# Patient Record
Sex: Female | Born: 1985 | Race: Black or African American | Hispanic: No | Marital: Married | Smoking: Never smoker
Health system: Southern US, Community
[De-identification: ages and names within clinical notes are randomized; demographics above are authoritative.]

## PROBLEM LIST (undated history)

## (undated) ENCOUNTER — Inpatient Hospital Stay (HOSPITAL_COMMUNITY): Payer: Self-pay

## (undated) DIAGNOSIS — R0602 Shortness of breath: Secondary | ICD-10-CM

## (undated) DIAGNOSIS — D649 Anemia, unspecified: Secondary | ICD-10-CM

## (undated) DIAGNOSIS — O4290 Premature rupture of membranes, unspecified as to length of time between rupture and onset of labor, unspecified weeks of gestation: Secondary | ICD-10-CM

## (undated) DIAGNOSIS — F32A Depression, unspecified: Secondary | ICD-10-CM

## (undated) DIAGNOSIS — Z460 Encounter for fitting and adjustment of spectacles and contact lenses: Secondary | ICD-10-CM

## (undated) DIAGNOSIS — D509 Iron deficiency anemia, unspecified: Secondary | ICD-10-CM

## (undated) DIAGNOSIS — Z98891 History of uterine scar from previous surgery: Secondary | ICD-10-CM

## (undated) HISTORY — DX: Anemia, unspecified: D64.9

## (undated) HISTORY — DX: Shortness of breath: R06.02

## (undated) HISTORY — DX: Encounter for fitting and adjustment of spectacles and contact lenses: Z46.0

---
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
## 2006-03-13 DIAGNOSIS — F319 Bipolar disorder, unspecified: Secondary | ICD-10-CM | POA: Insufficient documentation

## 2006-03-13 DIAGNOSIS — F411 Generalized anxiety disorder: Secondary | ICD-10-CM | POA: Insufficient documentation

## 2006-03-13 DIAGNOSIS — F410 Panic disorder [episodic paroxysmal anxiety] without agoraphobia: Secondary | ICD-10-CM | POA: Insufficient documentation

## 2007-07-09 ENCOUNTER — Telehealth: Payer: Self-pay | Admitting: *Deleted

## 2007-08-20 ENCOUNTER — Other Ambulatory Visit: Admission: RE | Admit: 2007-08-20 | Discharge: 2007-08-20 | Payer: Self-pay | Admitting: Obstetrics and Gynecology

## 2007-08-21 ENCOUNTER — Ambulatory Visit (HOSPITAL_COMMUNITY): Admission: RE | Admit: 2007-08-21 | Discharge: 2007-08-21 | Payer: Self-pay | Admitting: *Deleted

## 2007-08-28 ENCOUNTER — Ambulatory Visit (HOSPITAL_COMMUNITY): Admission: RE | Admit: 2007-08-28 | Discharge: 2007-08-28 | Payer: Self-pay | Admitting: *Deleted

## 2007-09-03 ENCOUNTER — Encounter: Admission: RE | Admit: 2007-09-03 | Discharge: 2007-10-06 | Payer: Self-pay | Admitting: *Deleted

## 2007-10-23 ENCOUNTER — Ambulatory Visit (HOSPITAL_COMMUNITY): Admission: RE | Admit: 2007-10-23 | Discharge: 2007-10-23 | Payer: Self-pay | Admitting: *Deleted

## 2008-03-18 ENCOUNTER — Emergency Department (HOSPITAL_COMMUNITY): Admission: EM | Admit: 2008-03-18 | Discharge: 2008-03-19 | Payer: Self-pay | Admitting: Emergency Medicine

## 2008-03-21 ENCOUNTER — Inpatient Hospital Stay (HOSPITAL_COMMUNITY): Admission: AD | Admit: 2008-03-21 | Discharge: 2008-03-22 | Payer: Self-pay | Admitting: Family Medicine

## 2008-03-21 ENCOUNTER — Ambulatory Visit: Payer: Self-pay | Admitting: Family Medicine

## 2008-03-31 ENCOUNTER — Encounter: Payer: Self-pay | Admitting: *Deleted

## 2008-08-25 ENCOUNTER — Encounter: Admission: RE | Admit: 2008-08-25 | Discharge: 2008-09-27 | Payer: Self-pay | Admitting: Surgery

## 2008-09-13 ENCOUNTER — Ambulatory Visit (HOSPITAL_COMMUNITY): Admission: RE | Admit: 2008-09-13 | Discharge: 2008-09-14 | Payer: Self-pay | Admitting: *Deleted

## 2008-09-13 HISTORY — PX: LAPAROSCOPIC GASTRIC BANDING: SHX1100

## 2008-09-27 ENCOUNTER — Encounter: Admission: RE | Admit: 2008-09-27 | Discharge: 2008-09-27 | Payer: Self-pay | Admitting: *Deleted

## 2010-04-20 LAB — DIFFERENTIAL
Basophils Absolute: 0 10*3/uL (ref 0.0–0.1)
Basophils Relative: 0 % (ref 0–1)
Lymphocytes Relative: 15 % (ref 12–46)
Monocytes Relative: 8 % (ref 3–12)
Neutrophils Relative %: 77 % (ref 43–77)

## 2010-04-20 LAB — CBC
Hemoglobin: 8.8 g/dL — ABNORMAL LOW (ref 12.0–15.0)
MCV: 70 fL — ABNORMAL LOW (ref 78.0–100.0)
RDW: 21.5 % — ABNORMAL HIGH (ref 11.5–15.5)

## 2010-04-21 LAB — COMPREHENSIVE METABOLIC PANEL
ALT: 16 U/L (ref 0–35)
Albumin: 3.7 g/dL (ref 3.5–5.2)
Alkaline Phosphatase: 70 U/L (ref 39–117)
Chloride: 106 mEq/L (ref 96–112)
Creatinine, Ser: 0.68 mg/dL (ref 0.4–1.2)
GFR calc Af Amer: >60 mL/min (ref >60)
Potassium: 3.8 mEq/L (ref 3.5–5.1)
Sodium: 136 mEq/L (ref 135–145)

## 2010-04-21 LAB — DIFFERENTIAL
Basophils Relative: 0 % (ref 0–1)
Eosinophils Absolute: 0.1 10*3/uL (ref 0.0–0.7)
Lymphs Abs: 2.2 10*3/uL (ref 0.7–4.0)
Monocytes Absolute: 0.5 10*3/uL (ref 0.1–1.0)

## 2010-04-21 LAB — CBC
MCV: 68.5 fL — ABNORMAL LOW (ref 78.0–100.0)
RBC: 4.15 MIL/uL (ref 3.87–5.11)

## 2010-04-21 LAB — HEMOGLOBIN AND HEMATOCRIT, BLOOD
HCT: 29.6 % — ABNORMAL LOW (ref 36.0–46.0)
Hemoglobin: 9.3 g/dL — ABNORMAL LOW (ref 12.0–15.0)

## 2010-04-21 LAB — PREGNANCY, URINE: Preg Test, Ur: NEGATIVE

## 2010-04-26 LAB — URINALYSIS, ROUTINE W REFLEX MICROSCOPIC
Hgb urine dipstick: NEGATIVE
Ketones, ur: 15 mg/dL — AB
Protein, ur: NEGATIVE mg/dL
Specific Gravity, Urine: 1.027 (ref 1.005–1.030)
pH: 5.5 (ref 5.0–8.0)

## 2010-04-26 LAB — COMPREHENSIVE METABOLIC PANEL
AST: 19 U/L (ref 0–37)
Alkaline Phosphatase: 70 U/L (ref 39–117)
BUN: 5 mg/dL — ABNORMAL LOW (ref 6–23)
CO2: 24 mEq/L (ref 19–32)
Calcium: 8.6 mg/dL (ref 8.4–10.5)
Glucose, Bld: 102 mg/dL — ABNORMAL HIGH (ref 70–99)
Potassium: 3 mEq/L — ABNORMAL LOW (ref 3.5–5.1)
Sodium: 135 mEq/L (ref 135–145)

## 2010-04-26 LAB — CULTURE, BLOOD (ROUTINE X 2): Culture: NO GROWTH

## 2010-04-26 LAB — HEPATIC FUNCTION PANEL
ALT: 14 U/L (ref 0–35)
AST: 14 U/L (ref 0–37)
Albumin: 3.4 g/dL — ABNORMAL LOW (ref 3.5–5.2)
Alkaline Phosphatase: 70 U/L (ref 39–117)
Indirect Bilirubin: 0.5 mg/dL (ref 0.3–0.9)
Total Bilirubin: 0.6 mg/dL (ref 0.3–1.2)

## 2010-04-26 LAB — RETICULOCYTES: Retic Count, Absolute: 37.5 10*3/uL (ref 19.0–186.0)

## 2010-04-26 LAB — CBC
HCT: 23.6 % — ABNORMAL LOW (ref 36.0–46.0)
HCT: 25.7 % — ABNORMAL LOW (ref 36.0–46.0)
Hemoglobin: 8.1 g/dL — ABNORMAL LOW (ref 12.0–15.0)
MCHC: 31.5 g/dL (ref 30.0–36.0)
MCV: 63 fL — ABNORMAL LOW (ref 78.0–100.0)
MCV: 64.3 fL — ABNORMAL LOW (ref 78.0–100.0)
Platelets: 318 10*3/uL (ref 150–400)
RDW: 21.3 % — ABNORMAL HIGH (ref 11.5–15.5)
RDW: 21.9 % — ABNORMAL HIGH (ref 11.5–15.5)

## 2010-04-26 LAB — POCT I-STAT, CHEM 8
BUN: 8 mg/dL (ref 6–23)
Calcium, Ion: 1.17 mmol/L (ref 1.12–1.32)
Chloride: 107 mEq/L (ref 96–112)
Glucose, Bld: 102 mg/dL — ABNORMAL HIGH (ref 70–99)
HCT: 34 % — ABNORMAL LOW (ref 36.0–46.0)
Hemoglobin: 11.6 g/dL — ABNORMAL LOW (ref 12.0–15.0)
Potassium: 4 mEq/L (ref 3.5–5.1)
TCO2: 21 mmol/L (ref 0–100)

## 2010-04-26 LAB — DIFFERENTIAL
Eosinophils Absolute: 0.1 10*3/uL (ref 0.0–0.7)
Eosinophils Relative: 1 % (ref 0–5)
Lymphocytes Relative: 27 % (ref 12–46)
Monocytes Relative: 7 % (ref 3–12)
Neutrophils Relative %: 65 % (ref 43–77)

## 2010-04-26 LAB — IRON AND TIBC
Iron: <10 ug/dL — ABNORMAL LOW (ref 42–135)
UIBC: 327 ug/dL

## 2010-04-26 LAB — URINE CULTURE: Colony Count: NO GROWTH

## 2010-04-26 LAB — BASIC METABOLIC PANEL
BUN: 6 mg/dL (ref 6–23)
Chloride: 106 mEq/L (ref 96–112)
Glucose, Bld: 85 mg/dL (ref 70–99)
Potassium: 3.6 mEq/L (ref 3.5–5.1)

## 2010-04-26 LAB — GRAM STAIN

## 2010-04-26 LAB — URINE MICROSCOPIC-ADD ON

## 2010-05-29 NOTE — H&P (Signed)
Victoria Knight, Victoria Knight             ACCOUNT NO.:  [id number]   MEDICAL RECORD NO.:  [id number]          PATIENT TYPE:  INP   LOCATION:  5128                         FACILITY:  [facility]   PHYSICIAN:  Pearlean Brownie, M.D.DATE OF BIRTH:  [birth year]   DATE OF ADMISSION:  03/21/2008  DATE OF DISCHARGE:                              HISTORY & PHYSICAL   PRIORITY ADMISSION HISTORY AND PHYSICAL   CHIEF COMPLAINT:  Abdominal pain and an anemia,   PRIMARY CARE PHYSICIAN:  [place].   HISTORY OF PRESENT ILLNESS:  This is a 25 year old female with a history  of anemia, who presented with a 3-day history of abdominal pain, fever.  She was seen at [facility] on the 5th, Friday, with  new onset nausea, vomiting, and fever along with dysuria, but her  vomiting was nonbilious and nonbloody and just seemed like food  particles.  She was diagnosed with pyelonephritis and sent home with  Keflex p.o. as well as ibuprofen, Percocet, and antinausea medicines.  Over the weekend, she took the Percocet, which knocked her out and  helped relieve pain, but upon stopping them the pain returned.  She  stopped them because she wanted to go to work today and was unable to  given the abdominal pain.  Her abdominal pain is described as a constant  and dull pain in the left upper and lower quadrants that radiates to the  back and flank area and intermittently sharp, stabbing pains that are 8  out of 10 in intensity along the back and flank areas, and she denies  any radiation just down into the groin or up into the chest.  She did  also continue to have fevers over the weekend so today she went to  [place] to be evaluated, and she was deemed to have failed outpatient  therapy and was sent here for admission and treatment.  She denies chest  pain or tightness, shortness of breath, cough, or recent viral  illnesses.  She also has no sick contacts or any vaginal discharge.  She  does endorse dysuria  and some spotting.  Her last menstrual period was  on the 13th of last month, and her hCG was negative in the ER.   The patient also endorses upon review of systems a watery diarrhea over  the weekend, which seems to have improved over the last 24 hours.  She  denies any blood in the stool.   REVIEW OF SYSTEMS:  As above; otherwise, negative.   ALLERGIES:  None.   MEDICATIONS:  1. Iron sulfate 325 mg b.i.d. but not taking secondary to nausea.  2. Keflex 500 mg q.i.d., a total of 10 days.  3. Percocet 5/325 mg 1 p.o. q.6 hours p.r.n. pain.  4. Hydrocodone 600 mg 1 p.o. q.6 hours p.r.n. pain.  5. Reglan 25 mg q.6 hours p.r.n.   PAST MEDICAL HISTORY:  1. Chronic anemia previously thought to be iron deficiency.  2. Obesity.   PAST SURGICAL HISTORY:  None.   SOCIAL HISTORY:  The patient lives with her husband here in town.  No  children or  pets.  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  She denies any smoking or alcohol or recreational drugs.   FAMILY HISTORY:  Significant for her father with hypertension and  diabetes.  No history of coronary artery disease, stroke, heart attack,  or cancers.   PHYSICAL EXAMINATION:  VITAL SIGNS:  Temperature 99.3, heart rate 83,  respiratory rate 20, blood pressure 136/84, O2 SAT 100% on room air.  GENERAL:  An African American female, obese, uncomfortable appearing on  the bed.  HEENT:  Pupils equally round and reactive to light, extraocular  movements intact, pale conjunctivae, slightly dry mucous membranes, no  pharyngeal erythema or edema.  NECK:  No lymphadenopathy, no thyromegaly.  CARDIOVASCULAR:  Normal S1 S2, no murmurs but somewhat distant heart  sounds secondary to habitus.  PULMONARY:  Clear to auscultation bilaterally, no crackles or wheezing.  ABDOMEN:  Soft, tender to palpation in the left upper and lower  quadrants and flank area along with CVA tenderness on the left and no  masses noted.  There is worse pain  in the flank.  EXTREMITIES:  2+ peripheral pulses, no edema.  SKIN:  No rash or jaundice.   LABORATORY DATA:  In the ER on the 5th of March, LFTs negative, iSTAT  with a hemoglobin of 11.6, sodium 138, potassium 4, chloride 107, bicarb  20s, BUN 8, creatinine 0.6, glucose 102.  Urinalysis was frankly red  with large blood, greater than 300 protein, small leukocyte-esterase,  many bacteria, 0 to 2 white blood cells, and too numerous to count1 red  blood cells.  CT obtained of the abdomen and pelvis showing no stones,  normal kidneys, question of mesenteric adenitis, normal appendix.   Labs at [facility] today showing white count of 6.6, hemoglobin of 7.4,  hematocrit 24.7, platelets 390, MCV of 61.7.  Urinalysis today at [facility]  was yellow, large blood, trace leukocyte-esterase, 3 to 5 red blood  cells, 3 to 6 white blood cells, and 1+ bacteria.   ASSESSMENT AND PLAN:  This is a 25 year old female with obesity and  chronic anemia, a question of iron deficiency, who presents with a 3-day  history of abdominal pain, fever, hematuria, and dysuria, presumed  pyelonephritis, who has failed outpatient therapy.  1. Abdominal pain.  Pyelonephritis versus kidney stones.  With      features of both, we will treat presumptively as pyelonephritis      with intravenous ceftriaxone and ampicillin to cover Enterococcus,      check blood cultures and urine cultures here as none have been      checked up to date.  We will also give Toradol and Vicodin as      Percocet were too strong for pain control.  If poor pain control,      we will add intravenous morphine.  Computerized tomography on the      5th showing no evidence of stones.  2. Hematuria.  Question of infection versus calculi.  Continue to      monitor.  3. Anemia.  Likely chronic in nature.  Patient states her period can      sometimes be heavy.  She is currently not taking iron secondary to      nausea so we will check an iron panel along with  ferritin and      reticulocyte count.  Pending CBC, we will consider a transfusion.      The likely threshold will be a hemoglobin of 7.  4. Fluids, electrolytes, nutrition, gastrointestinal.  Regular diet,      maintenance intravenous fluids at 125 mL per      hour, and monitor fluid status.  5. Prophylaxis.  Heparin.  6. Disposition.  Pending clinical improvement.      Eustaquio Boyden, MD  Electronically Signed      Pearlean Brownie, M.D.  Electronically Signed    JG/MEDQ  D:  03/21/2008  T:  03/21/2008  Job:  [id number]

## 2010-05-29 NOTE — Op Note (Signed)
Victoria Knight, Victoria Knight             ACCOUNT NO.:  [id number]   MEDICAL RECORD NO.:  [id number]         PATIENT TYPE:  LINP   LOCATION:                               FACILITY[facility]  [facility]   PHYSICIAN:  Thornton Park. Daphine Deutscher, MD  DATE OF BIRTH:  1985/01/21   DATE OF PROCEDURE:  09/13/2008  DATE OF DISCHARGE:                               OPERATIVE REPORT   PREOPERATIVE DIAGNOSIS:  Morbid obesity, body mass index of 55.   POSTOPERATIVE DIAGNOSIS:  Morbid obesity, body mass index of 55.   PROCEDURE:  Laparoscopic adjustable gastric band (APL system).   SURGEON:  Thornton Park. Daphine Deutscher, MD.   ASSISTANTMarland Kitchen  Sandria Bales. Ezzard Standing, M.D.   ANESTHESIA:  General endotracheal.   DESCRIPTION OF PROCEDURE:  [name] was taken to room 1 on Tuesday,  September 13, 2008 and given general anesthesia.  The abdomen was prepped  with Techni-Care equivalent and draped sterilely.  Access to the abdomen  was achieved through the left upper quadrant with a 0-degree Optiview  without difficulty.  The abdomen was insufflated and standard port  placement was used.  The liver was retracted with a Surveyor, mining.  Foregut dissection was performed.  She did have sort of an unusual  caudate lobe sticking out in a funny fashion and she had a very large  accessory vessel going to her liver.  We worked above that and created a  window for the band.  The band passer was passed without difficulty and  an APL band was selected based on her BMI and __________  was installed.  It was calibrated over the tubing.  It was placed and clamped down,  engaged below the black buckle.   It was held in place.  It was plicated with 4 sutures of Surgidac and  held in place with tie knots.  It was then brought out and placed in a  subcutaneous port pocket with mesh on the back with 4-0 Vicryls with  benzoin and Steri-Strips on the skin.  The patient tolerated procedure  well and was taken to the recovery room in satisfactory condition.      Thornton Park Daphine Deutscher, MD  Electronically Signed     MBM/MEDQ  D:  09/13/2008  T:  09/13/2008  Job:  [id number]   cc:   [facility]

## 2010-05-29 NOTE — Discharge Summary (Signed)
NAMEUNIKA, NAZARENO             ACCOUNT NO.:  [id number]   MEDICAL RECORD NO.:  [id number]          PATIENT TYPE:  INP   LOCATION:  5128                         FACILITY:  [facility]   PHYSICIAN:  Pearlean Brownie, M.D.DATE OF BIRTH:  30-Sep-1985   DATE OF ADMISSION:  03/21/2008  DATE OF DISCHARGE:  03/22/2008                               DISCHARGE SUMMARY   PRIMARY CARE Magaline Steinberg:  Peyton Najjar, MD, [facility].   DISCHARGE DIAGNOSES:  1. Presumed pyelonephritis.  2. Abdominal pain.  3. Iron deficiency anemia.  4. Morbid obesity.   DISCHARGE MEDICATIONS:  1. Iron sulfate 325 mg p.o. b.i.d.  2. Ciprofloxacin 500 mg p.o. b.i.d. x7 days.  3. Motrin 600 mg 1 p.o. q.8 h. p.r.n. pain.  4. Promethazine 25 mg 1 p.o. q.6 h. p.r.n. nausea and vomiting.  5. Pyridium 100 mg 1 p.o. t.i.d. x2 days.   DISCONTINUED MEDICATION:  Percocet 5/325 mg.   CONSULTS:  None.   PROCEDURES:  None.   DISCHARGE LABORATORY DATA:  Iron panel, total iron less than 10,  ferritin 29.  Hemoglobin 7.3, hematocrit 23.6, platelets 318, and white  count 6.3.  BMET, sodium 137, potassium 3.6, BUN 6, creatinine 0.64, and  glucose 85.  Urinalysis on March 21, 2008, negative except for small  bilirubin.  Urine Gram-stain negative for bacteria.  Blood culture  pending.  Urine culture pending.  Retic count 0.9% and absolute  reticulocytes 37.5.  Urine pregnancy on March 18, 2008, negative.   IMAGING:  CT of pelvis and abdomen on March 19, 2008.  Impression, no  evidence for acute appendicitis, question of mesenteric adenitis.  No CT  evidence for acute abnormality of the pelvis.  No evidence of kidney  stones.   BRIEF HOSPITAL COURSE:  A 25 year old female with history of anemia,  presented with abdominal pain.  The patient was initially evaluated at  [facility] ED for abdominal pain.  CT scan did not show any evidence of  acute abnormality.  Urinalysis at that time was positive for frank  hematuria.  The  patient was given Keflex for outpatient treatment of  pyelonephritis as well as Percocet and ibuprofen.  A couple of days  after initial evaluation in ED, the patient continued to have abdominal  pain as well as fevers, and was evaluated at primary care Bonna Steury's  office at [facility].  At that time, urinalysis was negative  except for a small amount of blood.  Hemoglobin was found to be 7.4.  The patient was admitted with concern for abdominal pain and presumed  pyelonephritis failed outpatient treatment.   1. Abdominal pain.  The patient with unclear etiology for abdominal      pain.  Physical exam during admission was positive only for left      flank tenderness/CVA tenderness.  Therefore, presumed diagnosis of      pyelonephritis was continued and the patient was started on      Rocephin and ampicillin.  The patient received approximately 30      hours of Rocephin and ampicillin prior to discharge.  Urinalysis  and urine Gram-stain did not show any evidence of bacteria or      pyuria.  CBC did not show any evidence of leukocytosis.  The      patient remained afebrile overnight on antibiotics.  Urine culture      and blood culture were sent; however, were pending at the time of      discharge.  Primary team will follow up with these results.  As      patient's exam was not consistent with any acute abdomen and the      patient had decreased pain overnight.  Decision was made, the      patient was stable enough to go home and complete course of      antibiotics for pyelonephritis.  The patient was discharged on      ciprofloxacin 500 mg p.o. b.i.d. for 7 days.  The patient was also      given Pyridium for dysuria with symptomatic relief, and may use      Motrin 600 mg q.6 h. as needed for pain.  2. Anemia.  The patient with history of anemia.  Hemoglobin was 7.4 at      primary care Sherell Christoffel's office and 7.3 during admission.      Transfusion threshold was 7.0 for the  patient.  It is of note that      the patient was seen in [facility] ED and iSTAT revealed a      hemoglobin of 11.6;however, this was thought to be a lab error as      this patient's baseline is normally 8.5.  As the patient was unable      to tolerate iron supplements at home during this acute illness,      decision was made to give IV iron while the patient was      hospitalized.  The patient was given iron dextran 500 mg IV over 4-      hour transfusion time prior to discharge.  The patient will need to      follow with primary care Akeila Lana for repeat CBC and will restart      p.o. iron supplementation approximately 2 weeks after the patient      has recovered from acute illness.  As suggested by primary care      Kavian Peters, we will refer to gynecology for any other further workup      as the patient has history of heavy menstrual cycles and OCPs for      further evaluation may be needed.  3. Morbid obesity, unchanged.   DISCHARGE INSTRUCTIONS:  The patient is to follow with primary Trino Higinbotham  this week to assess status post discharge.  The patient will return for  any increased abdominal pain, fever, or shortness of breath.   ISSUES FOR FOLLOWUP:  1. Final blood culture.  2. Final urine culture.  3. Hemoglobin with history of iron deficiency anemia.  4. Use of oral contraceptives.   FOLLOWUP APPOINTMENTS:  The patient is to follow with Dr. Alwyn Ren at  [facility], will call for appointment for this Friday, phone  number is [phone].   DISCHARGE CONDITION:  Stable/improved.   DISCHARGE LOCATION:  Home.       Milinda Antis, MD  Electronically Signed      Pearlean Brownie, M.D.  Electronically Signed    KD/MEDQ  D:  03/22/2008  T:  03/23/2008  Job:  [id number]   cc:   Peyton Najjar, MD

## 2010-11-09 ENCOUNTER — Encounter (INDEPENDENT_AMBULATORY_CARE_PROVIDER_SITE_OTHER): Payer: BC Managed Care – PPO

## 2010-11-28 ENCOUNTER — Encounter (INDEPENDENT_AMBULATORY_CARE_PROVIDER_SITE_OTHER): Payer: Self-pay | Admitting: Surgery

## 2010-11-28 DIAGNOSIS — Z460 Encounter for fitting and adjustment of spectacles and contact lenses: Secondary | ICD-10-CM | POA: Insufficient documentation

## 2010-11-30 ENCOUNTER — Encounter (INDEPENDENT_AMBULATORY_CARE_PROVIDER_SITE_OTHER): Payer: BC Managed Care – PPO

## 2010-12-13 ENCOUNTER — Encounter (INDEPENDENT_AMBULATORY_CARE_PROVIDER_SITE_OTHER): Payer: Self-pay | Admitting: Physician Assistant

## 2011-01-15 NOTE — L&D Delivery Note (Signed)
Cesarean Section Procedure Note  Indications: failure to progress: arrest of descent and failure to progress: arrest of dilation  Pre-operative Diagnosis: 39 week + day pregnancy.  Post-operative Diagnosis: same  Surgeon: Genia Del   Assistants: Arlan Organ  Anesthesia: Epidural anesthesia  ASA Class: 3  IV Ancef 3 g before surgery.   Procedure Details   The patient was seen in the Holding Room. The risks, benefits, complications, treatment options, and expected outcomes were discussed with the patient.  The patient concurred with the proposed plan, giving informed consent.  The site of surgery properly noted/marked. The patient was taken to Operating Room # 1, identified as Victoria Knight and the procedure verified as C-Section Delivery. A Time Out was held and the above information confirmed.  After induction of anesthesia, the patient was draped and prepped in the usual sterile manner. A Pfannenstiel incision was made and carried down through the subcutaneous tissue to the fascia. Fascial incision was made and extended transversely. The fascia was separated from the underlying rectus tissue superiorly and inferiorly. The peritoneum was identified and entered. Peritoneal incision was extended longitudinally. The utero-vesical peritoneal reflection was incised transversely and the bladder flap was bluntly freed from the lower uterine segment. A low transverse uterine incision was made. Delivered from cephalic presentation was a  Female with Apgar scores of 9 at one minute and 9 at five minutes. After the umbilical cord was clamped and cut cord blood was obtained for evaluation. The placenta was removed intact and appeared normal. The uterine outline, tubes and ovaries appeared normal. The uterine incision was closed with a locked running locked sutures of Vicryl-0.  A mattress suture of Vicryl-0 was added on the uterine incision. Hemostasis was observed. Lavage was carried out until  clear.  The peritoneum was closed with a running suture of Vicryl 2-0.  The fascia was then reapproximated with  running sutures of Vicryl-0.  The adipose tissue was closed with a running plain suture. The skin was reapproximated with Staples.  A compressive dry dressing was applied.  Instrument, sponge, and needle counts were correct prior the abdominal closure and at the conclusion of the case.    Estimated Blood Loss:  600         Specimens: Placenta         Complications:  None; patient tolerated the procedure well.         Disposition: PACU - hemodynamically stable.         Condition: stable  Attending Attestation: I was present and scrubbed for the entire procedure.  Genia Del MD   10/27/11 at 10:37 am

## 2011-02-25 ENCOUNTER — Encounter (HOSPITAL_COMMUNITY): Payer: Self-pay | Admitting: Emergency Medicine

## 2011-02-25 ENCOUNTER — Emergency Department (HOSPITAL_COMMUNITY)
Admission: EM | Admit: 2011-02-25 | Discharge: 2011-02-25 | Disposition: A | Discharge status: Home / Self Care | Payer: BC Managed Care – PPO | Attending: Emergency Medicine | Admitting: Emergency Medicine

## 2011-02-25 ENCOUNTER — Emergency Department (HOSPITAL_COMMUNITY): Payer: BC Managed Care – PPO

## 2011-02-25 DIAGNOSIS — O2390 Unspecified genitourinary tract infection in pregnancy, unspecified trimester: Secondary | ICD-10-CM | POA: Insufficient documentation

## 2011-02-25 DIAGNOSIS — N390 Urinary tract infection, site not specified: Secondary | ICD-10-CM | POA: Insufficient documentation

## 2011-02-25 DIAGNOSIS — O200 Threatened abortion: Secondary | ICD-10-CM | POA: Insufficient documentation

## 2011-02-25 DIAGNOSIS — O2341 Unspecified infection of urinary tract in pregnancy, first trimester: Secondary | ICD-10-CM

## 2011-02-25 LAB — URINALYSIS, ROUTINE W REFLEX MICROSCOPIC
Bilirubin Urine: NEGATIVE
Ketones, ur: >80 mg/dL — AB
Protein, ur: NEGATIVE mg/dL
Urobilinogen, UA: 1 mg/dL (ref 0.0–1.0)

## 2011-02-25 LAB — DIFFERENTIAL
Basophils Absolute: 0 10*3/uL (ref 0.0–0.1)
Eosinophils Absolute: 0.2 10*3/uL (ref 0.0–0.7)
Lymphocytes Relative: 24 % (ref 12–46)
Neutro Abs: 5.5 10*3/uL (ref 1.7–7.7)
Neutrophils Relative %: 70 % (ref 43–77)

## 2011-02-25 LAB — URINE MICROSCOPIC-ADD ON

## 2011-02-25 LAB — BASIC METABOLIC PANEL
Calcium: 9.3 mg/dL (ref 8.4–10.5)
Creatinine, Ser: 0.59 mg/dL (ref 0.50–1.10)
GFR calc non Af Amer: >90 mL/min (ref >90)
Glucose, Bld: 79 mg/dL (ref 70–99)
Sodium: 135 mEq/L (ref 135–145)

## 2011-02-25 LAB — CBC
HCT: 27.2 % — ABNORMAL LOW (ref 36.0–46.0)
MCHC: 30.1 g/dL (ref 30.0–36.0)
RDW: 21.4 % — ABNORMAL HIGH (ref 11.5–15.5)

## 2011-02-25 LAB — POCT PREGNANCY, URINE: Preg Test, Ur: POSITIVE — AB

## 2011-02-25 LAB — WET PREP, GENITAL: Yeast Wet Prep HPF POC: NONE SEEN

## 2011-02-25 MED ORDER — SODIUM CHLORIDE 0.9 % IV SOLN
INTRAVENOUS | Status: DC
  Administered 2011-02-25: 20:00:00 via INTRAVENOUS

## 2011-02-25 MED ORDER — NITROFURANTOIN MONOHYD MACRO 100 MG PO CAPS: 100.0000 mg | ORAL_CAPSULE | Freq: Two times a day (BID) | ORAL | Status: AC

## 2011-02-25 MED ORDER — ACETAMINOPHEN 325 MG PO TABS
650.0000 mg | ORAL_TABLET | Freq: Once | ORAL | Status: AC
  Administered 2011-02-25: 650 mg via ORAL
  Filled 2011-02-25: qty 2

## 2011-02-25 NOTE — ED Notes (Signed)
States is 5 weeks preg and is having vag bleeinding started today  Has only used 1 pad

## 2011-02-25 NOTE — ED Notes (Signed)
Pt. Discharged to home, pt. Alert and oriented, NAD noted 

## 2011-02-25 NOTE — ED Notes (Signed)
The pt 5 weeks preg and she has had vaginal bleeding today no pain anywhere

## 2011-02-25 NOTE — ED Provider Notes (Signed)
Medical screening examination/treatment/procedure(s) were conducted as a shared visit with non-physician practitioner(s) and myself.  I personally evaluated the patient during the encounter  Patient is a proximally [redacted] weeks pregnant. She presents with a small amount of vaginal bleeding has been present over the past one to 2 days. She notices it most when using the bathroom. She has no additional symptoms including abdominal pain or urinary symptoms. This is her first pregnancy.  No abdominal tenderness.  Patient is Rh+. Quant to 14,000. Ultrasound single IUP. Secondary to a threatened AB. I spoke with the patient's OB/GYN recommended close followup. She is placed on Dimas Alexandria, MD 02/25/11 2340

## 2011-02-25 NOTE — ED Provider Notes (Signed)
History     CSN: [id number]  Arrival date & time 02/25/11  1523   First MD Initiated Contact with Patient 02/25/11 1956      Chief Complaint  Patient presents with  . Vaginal Bleeding    (Consider location/radiation/quality/duration/timing/severity/associated sxs/prior treatment) Patient is a 26 y.o. female presenting with vaginal bleeding. The history is provided by the patient.  Vaginal Bleeding This is a new problem. The current episode started today. The problem occurs rarely. The problem has been unchanged. Pertinent negatives include no abdominal pain, chills or fever. Associated symptoms comments: She is approximately [redacted] weeks pregnant with small amount of vaginal bleeding today. No pain. She reports being under treatment for fertility, having taken Clomid until December, 2012, and Femara last month. She denies vaginal discharge, urinary symptoms, fever.. The symptoms are aggravated by nothing. She has tried nothing for the symptoms.    Past Medical History  Diagnosis Date  . SOB (shortness of breath)   . Arthritis   . Anemia   . Chronic kidney disease     possible stones  . Contact lens/glasses fitting     Past Surgical History  Procedure Date  . Laparoscopic gastric banding 09/13/08    Family History  Problem Relation Age of Onset  . Heart disease Father     congestive heart failure  . Hypertension Father   . Arthritis Father   . Diabetes Father   . Gout Father     History  Substance Use Topics  . Smoking status: Not on file  . Smokeless tobacco: Not on file  . Alcohol Use:     OB History    Grav Para Term Preterm Abortions TAB SAB Ect Mult Living                  Review of Systems  Constitutional: Negative for fever and chills.  HENT: Negative.   Respiratory: Negative.   Cardiovascular: Negative.   Gastrointestinal: Negative.  Negative for abdominal pain.  Genitourinary: Positive for vaginal bleeding. Negative for dysuria.  Musculoskeletal:  Negative.   Skin: Negative.   Neurological: Negative.     Allergies  Review of patient's allergies indicates no known allergies.  Home Medications   Current Outpatient Rx  Name Route Sig Dispense Refill  . IRON 325 (65 FE) MG PO TABS Oral Take by mouth.      . ONE-DAILY MULTI VITAMINS PO TABS Oral Take 1 tablet by mouth daily.      Marland Kitchen PRENATAL 27-0.8 MG PO TABS Oral Take 1 tablet by mouth daily.      BP 131/73  Pulse 86  Temp(Src) 99 F (37.2 C) (Oral)  Resp 20  SpO2 99%  Physical Exam  Constitutional: She appears well-developed and well-nourished.  HENT:  Head: Normocephalic.  Neck: Normal range of motion. Neck supple.  Cardiovascular: Normal rate and regular rhythm.   Pulmonary/Chest: Effort normal and breath sounds normal.  Abdominal: Soft. Bowel sounds are normal. There is no tenderness. There is no rebound and no guarding.  Genitourinary: Vagina normal and uterus normal. No vaginal discharge found.       No adnexal mass or tenderness. No CMT. Scant cervical bleeding through closed os.   Musculoskeletal: Normal range of motion.  Neurological: She is alert. No cranial nerve deficit.  Skin: Skin is warm and dry. No rash noted.  Psychiatric: She has a normal mood and affect.    ED Course  Procedures (including critical care time)  Labs Reviewed  CBC -  Abnormal; Notable for the following:    Hemoglobin 8.2 (*)    HCT 27.2 (*)    MCV 63.8 (*)    MCH 19.2 (*)    RDW 21.4 (*)    All other components within normal limits  BASIC METABOLIC PANEL - Abnormal; Notable for the following:    Potassium 3.4 (*)    BUN 5 (*)    All other components within normal limits  URINALYSIS, ROUTINE W REFLEX MICROSCOPIC - Abnormal; Notable for the following:    APPearance CLOUDY (*)    Hgb urine dipstick LARGE (*)    Ketones, ur >80 (*)    Leukocytes, UA MODERATE (*)    All other components within normal limits  POCT PREGNANCY, URINE - Abnormal; Notable for the following:    Preg  Test, Ur POSITIVE (*)    All other components within normal limits  HCG, QUANTITATIVE, PREGNANCY - Abnormal; Notable for the following:    hCG, Beta Chain, Quant, S 14451 (*)    All other components within normal limits  URINE MICROSCOPIC-ADD ON - Abnormal; Notable for the following:    Squamous Epithelial / LPF MANY (*)    Bacteria, UA MANY (*)    All other components within normal limits  WET PREP, GENITAL - Abnormal; Notable for the following:    Clue Cells Wet Prep HPF POC FEW (*)    WBC, Wet Prep HPF POC FEW (*)    All other components within normal limits  DIFFERENTIAL  ABO/RH  GC/CHLAMYDIA PROBE AMP, GENITAL   US Ob Comp Less 14 Wks  02/25/2011  *RADIOLOGY REPORT*  Clinical Data: Vaginal bleeding.  Quantitative beta HCG is 14,451. Estimated gestational age by LMP is 5 weeks 5 days.  OBSTETRIC <14 WK [place] AND TRANSVAGINAL OB US  Technique:  Both transabdominal and transvaginal ultrasound examinations were performed for complete evaluation of the gestation as well as the maternal uterus, adnexal regions, and pelvic cul-de-sac.  Transvaginal technique was performed to assess early pregnancy.  Comparison:  None.  Intrauterine gestational sac:  An ovoid single intrauterine gestational sac is not visualized.  Small amount of heterogeneous fluid in the lower uterine endometrial segment.  This may represent a small subchorionic hemorrhage. Yolk sac: Demonstrated Embryo: Not demonstrated Cardiac Activity: Not demonstrated Heart Rate: N/A bpm  CRL: 22   mm  7   w  1   d          [place] EDC: 10/13/2011  Maternal uterus/adnexae: No focal myometrial masses.  The right ovary measures 3.1 x 3.6 x 2.6 cm.  Complex cystic structure measuring about 1.6 x 0.8 cm likely representing an involuting cyst.  The left ovary measures 3.5 x 3.3 cm.  Central simple appearing cyst measuring about 2 cm diameter.  Small amount of free fluid in the pelvis.  IMPRESSION: Single intrauterine gestational sac visualized with yolk sac  present.  Fetal pole is not identified.  Recommend follow-up in 7- 10 days.  Heterogeneous fluid in the lower uterine segment may represent a small subchorionic hemorrhage.  Small amount of free fluid in the pelvis.  Original Report Authenticated By: Marlon Pel, M.D.   US Ob Transvaginal  02/25/2011  *RADIOLOGY REPORT*  Clinical Data: Vaginal bleeding.  Quantitative beta HCG is 14,451. Estimated gestational age by LMP is 5 weeks 5 days.  OBSTETRIC <14 WK [place] AND TRANSVAGINAL OB US  Technique:  Both transabdominal and transvaginal ultrasound examinations were performed for complete evaluation of the gestation  as well as the maternal uterus, adnexal regions, and pelvic cul-de-sac.  Transvaginal technique was performed to assess early pregnancy.  Comparison:  None.  Intrauterine gestational sac:  An ovoid single intrauterine gestational sac is not visualized.  Small amount of heterogeneous fluid in the lower uterine endometrial segment.  This may represent a small subchorionic hemorrhage. Yolk sac: Demonstrated Embryo: Not demonstrated Cardiac Activity: Not demonstrated Heart Rate: N/A bpm  CRL: 22   mm  7   w  1   d          [place] EDC: 10/13/2011  Maternal uterus/adnexae: No focal myometrial masses.  The right ovary measures 3.1 x 3.6 x 2.6 cm.  Complex cystic structure measuring about 1.6 x 0.8 cm likely representing an involuting cyst.  The left ovary measures 3.5 x 3.3 cm.  Central simple appearing cyst measuring about 2 cm diameter.  Small amount of free fluid in the pelvis.  IMPRESSION: Single intrauterine gestational sac visualized with yolk sac present.  Fetal pole is not identified.  Recommend follow-up in 7- 10 days.  Heterogeneous fluid in the lower uterine segment may represent a small subchorionic hemorrhage.  Small amount of free fluid in the pelvis.  Original Report Authenticated By: Marlon Pel, M.D.     No diagnosis found.    MDM          Rodena Medin, PA-C 02/25/11  2338

## 2011-02-25 NOTE — Discharge Instructions (Signed)
Threatened Miscarriage Bleeding during the first 20 weeks of pregnancy is common. This is sometimes called a threatened miscarriage. This is a pregnancy that is threatening to end before the twentieth week of pregnancy. Often this bleeding stops with bed rest or decreased activities as suggested by your caregiver and the pregnancy continues without any more problems. You may be asked to not have sexual intercourse, have orgasms or use tampons until further notice. Sometimes a threatened miscarriage can progress to a complete or incomplete miscarriage. This may or may not require further treatment. Some miscarriages occur before a woman misses a menstrual period and knows she is pregnant. Miscarriages occur in 15 to 20% of all pregnancies and usually occur during the first 13 weeks of the pregnancy. The exact cause of a miscarriage is usually never known. A miscarriage is natures way of ending a pregnancy that is abnormal or would not make it to term. There are some things that may put you at risk to have a miscarriage, such as:  Hormone problems.   Infection of the uterus or cervix.   Chronic illness, diabetes for example, especially if it is not controlled.   Abnormal shaped uterus.   Fibroids in the uterus.   Incompetent cervix (the cervix is too weak to hold the baby).   Smoking.   Drinking too much alcohol. It's best not to drink any alcohol when you are pregnant.   Taking illegal drugs.  TREATMENT  When a miscarriage becomes complete and all products of conception (all the tissue in the uterus) have been passed, often no treatment is needed. If you think you passed tissue, save it in a container and take it to your doctor for evaluation. If the miscarriage is incomplete (parts of the fetus or placenta remain in the uterus), further treatment may be needed. The most common reason for further treatment is continued bleeding (hemorrhage) because pregnancy tissue did not pass out of the  uterus. This often occurs if a miscarriage is incomplete. Tissue left behind may also become infected. Treatment usually is dilatation and curettage (the removal of the remaining products of pregnancy. This can be done by a simple sucking procedure (suction curettage) or a simple scraping of the inside of the uterus. This may be done in the hospital or in the caregiver's office. This is only done when your caregiver knows that there is no chance for the pregnancy to proceed to term. This is determined by physical examination, negative pregnancy test, falling pregnancy hormone count and/or, an ultrasound revealing a dead fetus. Miscarriages are often a very emotional time for prospective mothers and fathers. This is not you or your partners fault. It did not occur because of an inadequacy in you or your partner. Nearly all miscarriages occur because the pregnancy has started off wrongly. At least half of these pregnancies have a chromosomal abnormality. It is almost always not inherited. Others may have developmental problems with the fetus or placenta. This does not always show up even when the products miscarried are studied under the microscope. The miscarriage is nearly always not your fault and it is not likely that you could have prevented it from happening. If you are having emotional and grieving problems, talk to your health care provider and even seek counseling, if necessary, before getting pregnant again. You can begin trying for another pregnancy as soon as your caregiver says it is OK. HOME CARE INSTRUCTIONS   Your caregiver may order bed rest depending on how much bleeding   and cramping you are having. You may be limited to only getting up to go to the bathroom. You may be allowed to continue light activity. You may need to make arrangements for the care of your other children and for any other responsibilities.   Keep track of the number of pads you use each day, how often you have to change pads  and how saturated (soaked) they are. Record this information.   DO NOT USE TAMPONS. Do not douche, have sexual intercourse or orgasms until approved by your caregiver.   You may receive a follow up appointment for re-evaluation of your pregnancy and a repeat blood test. Re-evaluation often occurs after 2 days and again in 4 to 6 weeks. It is very important that you follow-up in the recommended time period.   If you are Rh negative and the father is Rh positive or you do not know the fathers' blood type, you may receive a shot (Rh immune globulin) to help prevent abnormal antibodies that can develop and affect the baby in any future pregnancies.  SEEK IMMEDIATE MEDICAL CARE IF:  You have severe cramps in your stomach, back, or abdomen.   You have a sudden onset of severe pain in the lower part of your abdomen.   You develop chills.   You run an unexplained temperature of 101 F (38.3 C) or higher.   You pass large clots or tissue. Save any tissue for your caregiver to inspect.   Your bleeding increases or you become light-headed, weak, or have fainting episodes.   You have a gush of fluid from your vagina.   You pass out. This could mean you have a tubal (ectopic) pregnancy.  Document Released: 12/31/2004 Document Revised: 09/12/2010 Document Reviewed: 08/17/2007 Exit[employer] Patient Information 2012 [employer].  Urinary Tract Infection Infections of the urinary tract can start in several places. A bladder infection (cystitis), a kidney infection (pyelonephritis), and a prostate infection (prostatitis) are different types of urinary tract infections (UTIs). They usually get better if treated with medicines (antibiotics) that kill germs. Take all the medicine until it is gone. You or your child may feel better in a few days, but TAKE ALL MEDICINE or the infection may not respond and may become more difficult to treat. HOME CARE INSTRUCTIONS   Drink enough water and fluids to keep the  urine clear or pale yellow. Cranberry juice is especially recommended, in addition to large amounts of water.   Avoid caffeine, tea, and carbonated beverages. They tend to irritate the bladder.   Alcohol may irritate the prostate.   Only take over-the-counter or prescription medicines for pain, discomfort, or fever as directed by your caregiver.  To prevent further infections:  Empty the bladder often. Avoid holding urine for long periods of time.   After a bowel movement, women should cleanse from front to back. Use each tissue only once.   Empty the bladder before and after sexual intercourse.  FINDING OUT THE RESULTS OF YOUR TEST Not all test results are available during your visit. If your or your child's test results are not back during the visit, make an appointment with your caregiver to find out the results. Do not assume everything is normal if you have not heard from your caregiver or the medical facility. It is important for you to follow up on all test results. SEEK MEDICAL CARE IF:   There is back pain.   Your baby is older than 3 months with a rectal temperature   of 100.5 F (38.1 C) or higher for more than 1 day.   Your or your child's problems (symptoms) are no better in 3 days. Return sooner if you or your child is getting worse.  SEEK IMMEDIATE MEDICAL CARE IF:   There is severe back pain or lower abdominal pain.   You or your child develops chills.   You have a fever.   Your baby is older than 3 months with a rectal temperature of 102 F (38.9 C) or higher.   Your baby is 3 months old or younger with a rectal temperature of 100.4 F (38 C) or higher.   There is nausea or vomiting.   There is continued burning or discomfort with urination.  MAKE SURE YOU:   Understand these instructions.   Will watch your condition.   Will get help right away if you are not doing well or get worse.  Document Released: 10/10/2004 Document Revised: 09/12/2010 Document  Reviewed: 05/15/2006 Exit[employer] Patient Information 2012 [employer]. 

## 2011-02-25 NOTE — ED Notes (Signed)
Pelvic exam set up and pt undressed and ready

## 2011-02-25 NOTE — ED Notes (Signed)
Pt c/o headache notified edpa

## 2011-03-19 ENCOUNTER — Telehealth (INDEPENDENT_AMBULATORY_CARE_PROVIDER_SITE_OTHER): Payer: Self-pay | Admitting: Surgery

## 2011-03-19 NOTE — Telephone Encounter (Signed)
03/19/11 mailed recall letter for bariatric surgery f/u to patient. Adv pt to call [facility] @ [phone] to schedule an appointment....cef 

## 2011-03-25 LAB — OB RESULTS CONSOLE ANTIBODY SCREEN: Antibody Screen: NEGATIVE

## 2011-05-31 ENCOUNTER — Other Ambulatory Visit: Payer: Self-pay

## 2011-06-03 ENCOUNTER — Other Ambulatory Visit (HOSPITAL_COMMUNITY): Payer: Self-pay | Admitting: Obstetrics & Gynecology

## 2011-06-03 DIAGNOSIS — Z3689 Encounter for other specified antenatal screening: Secondary | ICD-10-CM

## 2011-06-07 ENCOUNTER — Ambulatory Visit (HOSPITAL_COMMUNITY)
Admission: RE | Admit: 2011-06-07 | Discharge: 2011-06-07 | Disposition: A | Discharge status: Home / Self Care | Payer: BC Managed Care – PPO | Source: Ambulatory Visit | Attending: Obstetrics & Gynecology | Admitting: Obstetrics & Gynecology

## 2011-06-07 DIAGNOSIS — Z3689 Encounter for other specified antenatal screening: Secondary | ICD-10-CM

## 2011-06-07 DIAGNOSIS — Z363 Encounter for antenatal screening for malformations: Secondary | ICD-10-CM | POA: Insufficient documentation

## 2011-06-07 DIAGNOSIS — O358XX0 Maternal care for other (suspected) fetal abnormality and damage, not applicable or unspecified: Secondary | ICD-10-CM | POA: Insufficient documentation

## 2011-06-07 DIAGNOSIS — Z0489 Encounter for examination and observation for other specified reasons: Secondary | ICD-10-CM

## 2011-06-07 DIAGNOSIS — Z1389 Encounter for screening for other disorder: Secondary | ICD-10-CM | POA: Insufficient documentation

## 2011-06-07 DIAGNOSIS — E669 Obesity, unspecified: Secondary | ICD-10-CM | POA: Insufficient documentation

## 2011-06-07 DIAGNOSIS — IMO0002 Reserved for concepts with insufficient information to code with codable children: Secondary | ICD-10-CM

## 2011-06-07 NOTE — Progress Notes (Signed)
Ms. Victoria Knight was seen for ultrasound appointment today.  Please see [facility] report for details.

## 2011-07-05 ENCOUNTER — Ambulatory Visit (HOSPITAL_COMMUNITY)
Admission: RE | Admit: 2011-07-05 | Discharge: 2011-07-05 | Disposition: A | Discharge status: Home / Self Care | Payer: BC Managed Care – PPO | Source: Ambulatory Visit | Attending: Obstetrics & Gynecology | Admitting: Obstetrics & Gynecology

## 2011-07-05 ENCOUNTER — Encounter (HOSPITAL_COMMUNITY): Payer: Self-pay

## 2011-07-05 DIAGNOSIS — E669 Obesity, unspecified: Secondary | ICD-10-CM | POA: Insufficient documentation

## 2011-07-05 DIAGNOSIS — Z0489 Encounter for examination and observation for other specified reasons: Secondary | ICD-10-CM

## 2011-07-05 DIAGNOSIS — O358XX0 Maternal care for other (suspected) fetal abnormality and damage, not applicable or unspecified: Secondary | ICD-10-CM | POA: Insufficient documentation

## 2011-07-05 DIAGNOSIS — IMO0002 Reserved for concepts with insufficient information to code with codable children: Secondary | ICD-10-CM

## 2011-07-05 DIAGNOSIS — Z1389 Encounter for screening for other disorder: Secondary | ICD-10-CM | POA: Insufficient documentation

## 2011-07-05 DIAGNOSIS — Z363 Encounter for antenatal screening for malformations: Secondary | ICD-10-CM | POA: Insufficient documentation

## 2011-07-05 DIAGNOSIS — O99210 Obesity complicating pregnancy, unspecified trimester: Secondary | ICD-10-CM | POA: Insufficient documentation

## 2011-08-02 LAB — OB RESULTS CONSOLE GBS: GBS: NEGATIVE

## 2011-08-02 LAB — OB RESULTS CONSOLE ANTIBODY SCREEN: Antibody Screen: NEGATIVE

## 2011-08-02 LAB — OB RESULTS CONSOLE HEPATITIS B SURFACE ANTIGEN: Hepatitis B Surface Ag: NEGATIVE

## 2011-08-06 ENCOUNTER — Other Ambulatory Visit: Payer: Self-pay | Admitting: Obstetrics & Gynecology

## 2011-08-21 ENCOUNTER — Other Ambulatory Visit: Payer: Self-pay | Admitting: Obstetrics & Gynecology

## 2011-08-22 ENCOUNTER — Inpatient Hospital Stay (HOSPITAL_COMMUNITY)
Admission: AD | Admit: 2011-08-22 | Discharge: 2011-08-23 | Disposition: A | Discharge status: Home / Self Care | Payer: BC Managed Care – PPO | Source: Ambulatory Visit | Attending: Obstetrics & Gynecology | Admitting: Obstetrics & Gynecology

## 2011-08-23 ENCOUNTER — Inpatient Hospital Stay (HOSPITAL_COMMUNITY)
Admission: AD | Admit: 2011-08-23 | Discharge: 2011-08-23 | Disposition: A | Discharge status: Home / Self Care | Payer: BC Managed Care – PPO | Source: Ambulatory Visit | Attending: Obstetrics & Gynecology | Admitting: Obstetrics & Gynecology

## 2011-08-23 ENCOUNTER — Encounter (HOSPITAL_COMMUNITY): Payer: Self-pay | Admitting: *Deleted

## 2011-08-23 DIAGNOSIS — O99019 Anemia complicating pregnancy, unspecified trimester: Secondary | ICD-10-CM | POA: Insufficient documentation

## 2011-08-23 DIAGNOSIS — D649 Anemia, unspecified: Secondary | ICD-10-CM | POA: Insufficient documentation

## 2011-08-23 MED ORDER — FERUMOXYTOL INJECTION 510 MG/17 ML
510.0000 mg | Freq: Once | INTRAVENOUS | Status: AC
  Administered 2011-08-23: 510 mg via INTRAVENOUS
  Filled 2011-08-23: qty 17

## 2011-08-23 NOTE — MAU Note (Signed)
Dr. Juliene Pina on the unit, spoke to the pt about returning to MAU at a later time for IV iron.  Dr. Juliene Pina talked to pt about infusion not being an emergent situation and to return at a better time.  MAU to call pt during day time hours to return for infusion.

## 2011-08-30 ENCOUNTER — Inpatient Hospital Stay (HOSPITAL_COMMUNITY)
Admission: AD | Admit: 2011-08-30 | Discharge: 2011-08-30 | Disposition: A | Discharge status: Home / Self Care | Payer: BC Managed Care – PPO | Source: Ambulatory Visit | Attending: Obstetrics and Gynecology | Admitting: Obstetrics and Gynecology

## 2011-08-30 ENCOUNTER — Encounter (HOSPITAL_COMMUNITY): Payer: Self-pay | Admitting: *Deleted

## 2011-08-30 DIAGNOSIS — O99019 Anemia complicating pregnancy, unspecified trimester: Secondary | ICD-10-CM | POA: Insufficient documentation

## 2011-08-30 DIAGNOSIS — D649 Anemia, unspecified: Secondary | ICD-10-CM | POA: Insufficient documentation

## 2011-08-30 MED ORDER — SODIUM CHLORIDE 0.9 % IV SOLN
INTRAVENOUS | Status: DC
  Administered 2011-08-30: 500 mL via INTRAVENOUS

## 2011-08-30 MED ORDER — FERUMOXYTOL INJECTION 510 MG/17 ML
510.0000 mg | Freq: Once | INTRAVENOUS | Status: AC
  Administered 2011-08-30: 510 mg via INTRAVENOUS
  Filled 2011-08-30: qty 17

## 2011-08-30 NOTE — ED Notes (Signed)
Pt tolerated IV iron infusion.

## 2011-08-30 NOTE — MAU Note (Signed)
Here for IV iron infusion

## 2011-09-14 ENCOUNTER — Inpatient Hospital Stay (HOSPITAL_COMMUNITY)
Admission: AD | Admit: 2011-09-14 | Discharge: 2011-09-14 | Disposition: A | Discharge status: Home / Self Care | Payer: BC Managed Care – PPO | Source: Ambulatory Visit | Attending: Obstetrics & Gynecology | Admitting: Obstetrics & Gynecology

## 2011-09-14 DIAGNOSIS — D649 Anemia, unspecified: Secondary | ICD-10-CM | POA: Insufficient documentation

## 2011-09-14 DIAGNOSIS — O99019 Anemia complicating pregnancy, unspecified trimester: Secondary | ICD-10-CM | POA: Insufficient documentation

## 2011-09-14 MED ORDER — FERUMOXYTOL INJECTION 510 MG/17 ML
510.0000 mg | Freq: Once | INTRAVENOUS | Status: AC
  Administered 2011-09-14: 510 mg via INTRAVENOUS
  Filled 2011-09-14: qty 17

## 2011-09-14 MED ORDER — SODIUM CHLORIDE 0.9 % IV SOLN
INTRAVENOUS | Status: DC
  Administered 2011-09-14: 09:00:00 via INTRAVENOUS

## 2011-10-26 ENCOUNTER — Inpatient Hospital Stay (HOSPITAL_COMMUNITY)
Admission: AD | Admit: 2011-10-26 | Discharge: 2011-10-30 | DRG: 370 | Disposition: A | Discharge status: Home / Self Care | Payer: BC Managed Care – PPO | Source: Ambulatory Visit | Attending: Obstetrics & Gynecology | Admitting: Obstetrics & Gynecology

## 2011-10-26 ENCOUNTER — Encounter (HOSPITAL_COMMUNITY): Payer: Self-pay | Admitting: Obstetrics and Gynecology

## 2011-10-26 DIAGNOSIS — E669 Obesity, unspecified: Secondary | ICD-10-CM | POA: Diagnosis present

## 2011-10-26 DIAGNOSIS — Z98891 History of uterine scar from previous surgery: Secondary | ICD-10-CM

## 2011-10-26 DIAGNOSIS — O4290 Premature rupture of membranes, unspecified as to length of time between rupture and onset of labor, unspecified weeks of gestation: Secondary | ICD-10-CM | POA: Diagnosis present

## 2011-10-26 DIAGNOSIS — O324XX0 Maternal care for high head at term, not applicable or unspecified: Secondary | ICD-10-CM | POA: Diagnosis present

## 2011-10-26 DIAGNOSIS — O9902 Anemia complicating childbirth: Secondary | ICD-10-CM | POA: Diagnosis present

## 2011-10-26 DIAGNOSIS — O99214 Obesity complicating childbirth: Secondary | ICD-10-CM | POA: Diagnosis present

## 2011-10-26 DIAGNOSIS — D509 Iron deficiency anemia, unspecified: Secondary | ICD-10-CM | POA: Diagnosis present

## 2011-10-26 HISTORY — DX: Premature rupture of membranes, unspecified as to length of time between rupture and onset of labor, unspecified weeks of gestation: O42.90

## 2011-10-26 HISTORY — DX: History of uterine scar from previous surgery: Z98.891

## 2011-10-26 HISTORY — DX: Iron deficiency anemia, unspecified: D50.9

## 2011-10-26 LAB — CBC
HCT: 32 % — ABNORMAL LOW (ref 36.0–46.0)
MCHC: 33.1 g/dL (ref 30.0–36.0)
Platelets: 207 10*3/uL (ref 150–400)
RDW: 24.4 % — ABNORMAL HIGH (ref 11.5–15.5)
WBC: 8.5 10*3/uL (ref 4.0–10.5)

## 2011-10-26 MED ORDER — ACETAMINOPHEN 325 MG PO TABS: 650.0000 mg | ORAL_TABLET | ORAL | Status: DC | PRN

## 2011-10-26 MED ORDER — EPHEDRINE 5 MG/ML INJ
10.0000 mg | INTRAVENOUS | Status: AC | PRN
  Administered 2011-10-27 (×2): 10 mg via INTRAVENOUS
  Filled 2011-10-26: qty 4

## 2011-10-26 MED ORDER — PHENYLEPHRINE 40 MCG/ML (10ML) SYRINGE FOR IV PUSH (FOR BLOOD PRESSURE SUPPORT)
80.0000 ug | PREFILLED_SYRINGE | INTRAVENOUS | Status: DC | PRN
Start: 2011-10-26 — End: 2011-10-27

## 2011-10-26 MED ORDER — LIDOCAINE HCL (PF) 1 % IJ SOLN: 30.0000 mL | INTRAMUSCULAR | Status: DC | PRN

## 2011-10-26 MED ORDER — IBUPROFEN 600 MG PO TABS: 600.0000 mg | ORAL_TABLET | Freq: Four times a day (QID) | ORAL | Status: DC | PRN

## 2011-10-26 MED ORDER — LACTATED RINGERS IV SOLN: INTRAVENOUS | Status: DC

## 2011-10-26 MED ORDER — FENTANYL 2.5 MCG/ML BUPIVACAINE 1/10 % EPIDURAL INFUSION (WH - ANES)
14.0000 mL/h | INTRAMUSCULAR | Status: DC
Start: 2011-10-26 — End: 2011-10-27
  Administered 2011-10-27: 14 mL/h via EPIDURAL
  Filled 2011-10-26 (×2): qty 125

## 2011-10-26 MED ORDER — POLYSACCHARIDE IRON COMPLEX 150 MG PO CAPS
150.0000 mg | ORAL_CAPSULE | Freq: Two times a day (BID) | ORAL | Status: DC
  Administered 2011-10-27: 150 mg via ORAL
  Filled 2011-10-26 (×3): qty 1

## 2011-10-26 MED ORDER — EPHEDRINE 5 MG/ML INJ: 10.0000 mg | INTRAVENOUS | Status: DC | PRN

## 2011-10-26 MED ORDER — OXYTOCIN BOLUS FROM INFUSION
500.0000 mL | Freq: Once | INTRAVENOUS | Status: DC
  Filled 2011-10-26: qty 500

## 2011-10-26 MED ORDER — CITRIC ACID-SODIUM CITRATE 334-500 MG/5ML PO SOLN
30.0000 mL | ORAL | Status: DC | PRN
  Administered 2011-10-27: 30 mL via ORAL
  Filled 2011-10-26 (×2): qty 15

## 2011-10-26 MED ORDER — ZOLPIDEM TARTRATE 5 MG PO TABS: 5.0000 mg | ORAL_TABLET | Freq: Every evening | ORAL | Status: DC | PRN

## 2011-10-26 MED ORDER — OXYCODONE-ACETAMINOPHEN 5-325 MG PO TABS: 1.0000 | ORAL_TABLET | ORAL | Status: DC | PRN

## 2011-10-26 MED ORDER — LIDOCAINE HCL (PF) 1 % IJ SOLN
INTRAMUSCULAR | Status: AC
  Filled 2011-10-26: qty 5

## 2011-10-26 MED ORDER — LACTATED RINGERS IV SOLN: 500.0000 mL | Freq: Once | INTRAVENOUS | Status: DC

## 2011-10-26 MED ORDER — DIPHENHYDRAMINE HCL 50 MG/ML IJ SOLN: 12.5000 mg | INTRAMUSCULAR | Status: DC | PRN

## 2011-10-26 MED ORDER — LACTATED RINGERS IV SOLN
500.0000 mL | INTRAVENOUS | Status: DC | PRN
  Administered 2011-10-27: 500 mL via INTRAVENOUS

## 2011-10-26 MED ORDER — PHENYLEPHRINE 40 MCG/ML (10ML) SYRINGE FOR IV PUSH (FOR BLOOD PRESSURE SUPPORT)
80.0000 ug | PREFILLED_SYRINGE | INTRAVENOUS | Status: DC | PRN
  Filled 2011-10-26: qty 5

## 2011-10-26 MED ORDER — OXYTOCIN 40 UNITS IN LACTATED RINGERS INFUSION - SIMPLE MED
1.0000 m[IU]/min | INTRAVENOUS | Status: DC
  Administered 2011-10-26: 2 m[IU]/min via INTRAVENOUS
  Filled 2011-10-26: qty 1000

## 2011-10-26 MED ORDER — TERBUTALINE SULFATE 1 MG/ML IJ SOLN: 0.2500 mg | Freq: Once | INTRAMUSCULAR | Status: AC | PRN

## 2011-10-26 MED ORDER — OXYTOCIN 10 UNIT/ML IJ SOLN: 10.0000 [IU] | Freq: Once | INTRAMUSCULAR | Status: DC

## 2011-10-26 MED ORDER — OXYTOCIN 40 UNITS IN LACTATED RINGERS INFUSION - SIMPLE MED: 62.5000 mL/h | Freq: Once | INTRAVENOUS | Status: DC

## 2011-10-26 MED ORDER — FLEET ENEMA 7-19 GM/118ML RE ENEM: 1.0000 | ENEMA | RECTAL | Status: DC | PRN

## 2011-10-26 MED ORDER — ONDANSETRON HCL 4 MG/2ML IJ SOLN: 4.0000 mg | Freq: Four times a day (QID) | INTRAMUSCULAR | Status: DC | PRN

## 2011-10-26 NOTE — H&P (Signed)
Victoria Knight is a 26 y.o. female G1P0 at [redacted]w[redacted]d presenting for LOF since 1115; Ctx slightly increased in strength over past few hours, but still able to talk through them.  Now admitted for active management after review of options.    PNC at [facility] since 10 wks, primary Dr. Juliene Pina. . Maternal Medical History:  Reason for admission: Reason for admission: rupture of membranes.  Reason for Admission:   nausea (earlier in the day, now resolved)Contractions: Frequency: regular.   Duration is approximately 3 minutes.   Perceived severity is mild.    Fetal activity: Perceived fetal activity is decreased.   Last perceived fetal movement was within the past hour.    Prenatal complications: Femara conception, obesity, s/p lap band surgery, iron deficiency anemia (s/p IV iron x 3 antepartum)  Prenatal Complications - Diabetes: none.    OB History    Grav Para Term Preterm Abortions TAB SAB Ect Mult Living   1 0 0 0 0 0 0 0 0 0      Past Medical History  Diagnosis Date  . SOB (shortness of breath)   . Arthritis   . Anemia   . Chronic kidney disease     possible stones  . Contact lens/glasses fitting    Past Surgical History  Procedure Date  . Laparoscopic gastric banding 09/13/08   Family History: family history includes Arthritis in her father; Diabetes in her father; Gout in her father; Heart disease in her father; and Hypertension in her father. Social History:  reports that she has never smoked. She does not have any smokeless tobacco history on file. She reports that she does not drink alcohol or use illicit drugs.   Review of Systems  Constitutional: Negative for fever and chills.  Eyes: Negative for blurred vision.  Gastrointestinal: Negative for heartburn and nausea (earlier in the day, now resolved).  Genitourinary:       LOF, clear since  Neurological: Negative for headaches.  All other systems reviewed and are negative.    Dilation:  (1-2) Effacement (%):  50 Station: Ballotable;-3 Exam by:: D. Paul CnM  Blood pressure 129/90, pulse 84, temperature 98.9 F (37.2 C), temperature source Oral, resp. rate 20, height 5\' 1"  (1.549 m), weight 126.372 kg (278 lb 9.6 oz), last menstrual period 01/21/2011, SpO2 99.00%. Maternal Exam:  Uterine Assessment: Contraction strength is mild.  Contraction duration is 3 minutes. Contraction frequency is regular.   Abdomen: Patient reports no abdominal tenderness. Fundal height is S=D.   Estimated fetal weight is 7'3.   Fetal presentation: vertex  Introitus: Normal vulva. Normal vagina.  Amniotic fluid character: clear.  Pelvis: adequate for delivery.   Cervix: Cervix evaluated by digital exam.     Fetal Exam Fetal Monitor Review: Mode: ultrasound.   Baseline rate: 130.  Variability: moderate (6-25 bpm).   Pattern: accelerations present and no decelerations.    Fetal State Assessment: Category I - tracings are normal.     Physical Exam  Constitutional: She is oriented to person, place, and time. She appears well-developed and well-nourished. No distress.  HENT:  Head: Normocephalic.  Eyes: Pupils are equal, round, and reactive to light.  Neck: Normal range of motion.  Cardiovascular: Normal rate and regular rhythm.   Respiratory: Effort normal and breath sounds normal.  GI: Soft. She exhibits no distension.       gravid  Genitourinary: Vagina normal.  Musculoskeletal: Normal range of motion. She exhibits edema (trace pedal).  Neurological: She is alert and  oriented to person, place, and time.  Skin: Skin is warm and dry.  Psychiatric: She has a normal mood and affect.    Prenatal labs: ABO, Rh: --/--/O POS (02/11 1856) Antibody:  neg Rubella:  immune RPR:   NR HBsAg:   NR HIV:   NR No sickle cell  GBS: Negative (09/23 0000)  Quad screen normal 1GTT: 135  Assessment/Plan:  IUP at term, PROM  Fetal tracing category 1 GBS neg  Admit to BS Routine orders Pitocin  augmentation Analgesia / anesthesia PRN  Consult Dr. Newman Pies, CNM 10/26/2011, 6:29 PM

## 2011-10-26 NOTE — Progress Notes (Signed)
RN spoke to [employer]. Stated not SVE because of prolonged rupture and to let patient walk around when desires.

## 2011-10-26 NOTE — Progress Notes (Signed)
RN spoke to CNM to clarify about patient regular diet order and pitocin order.  CNM stated that patient can eat until she gets her epidural and that the pitocin needs to be started as soon as possible.

## 2011-10-26 NOTE — MAU Note (Signed)
Patient states she has been leaking clear fluid since 1115. Having some very mild irregular contractions. States she has been feeling movement but not as active as usual.

## 2011-10-27 ENCOUNTER — Encounter (HOSPITAL_COMMUNITY): Payer: Self-pay | Admitting: Anesthesiology

## 2011-10-27 ENCOUNTER — Encounter (HOSPITAL_COMMUNITY): Payer: Self-pay | Admitting: *Deleted

## 2011-10-27 ENCOUNTER — Encounter (HOSPITAL_COMMUNITY): Payer: Self-pay

## 2011-10-27 ENCOUNTER — Inpatient Hospital Stay (HOSPITAL_COMMUNITY): Payer: BC Managed Care – PPO | Admitting: Anesthesiology

## 2011-10-27 ENCOUNTER — Encounter (HOSPITAL_COMMUNITY)
Admission: AD | Disposition: A | Discharge status: Home / Self Care | Payer: Self-pay | Source: Ambulatory Visit | Attending: Obstetrics & Gynecology

## 2011-10-27 DIAGNOSIS — D509 Iron deficiency anemia, unspecified: Secondary | ICD-10-CM | POA: Diagnosis present

## 2011-10-27 HISTORY — DX: Iron deficiency anemia, unspecified: D50.9

## 2011-10-27 LAB — TYPE AND SCREEN

## 2011-10-27 LAB — ABO/RH: ABO/RH(D): O POS

## 2011-10-27 SURGERY — Surgical Case
Anesthesia: Epidural | Site: Abdomen | Wound class: Clean Contaminated

## 2011-10-27 MED ORDER — LACTATED RINGERS IV SOLN
INTRAVENOUS | Status: DC
  Administered 2011-10-27: 300 mL via INTRAUTERINE

## 2011-10-27 MED ORDER — IBUPROFEN 600 MG PO TABS
600.0000 mg | ORAL_TABLET | Freq: Four times a day (QID) | ORAL | Status: DC
  Administered 2011-10-28 – 2011-10-30 (×11): 600 mg via ORAL
  Filled 2011-10-27 (×2): qty 1

## 2011-10-27 MED ORDER — ONDANSETRON HCL 4 MG/2ML IJ SOLN
INTRAMUSCULAR | Status: DC | PRN
  Administered 2011-10-27: 4 mg via INTRAVENOUS

## 2011-10-27 MED ORDER — FERROUS SULFATE 325 (65 FE) MG PO TABS
325.0000 mg | ORAL_TABLET | Freq: Two times a day (BID) | ORAL | Status: DC
  Administered 2011-10-28: 325 mg via ORAL
  Filled 2011-10-27: qty 1

## 2011-10-27 MED ORDER — DIBUCAINE 1 % RE OINT: 1.0000 "application " | TOPICAL_OINTMENT | RECTAL | Status: DC | PRN

## 2011-10-27 MED ORDER — NALOXONE HCL 0.4 MG/ML IJ SOLN: 0.4000 mg | INTRAMUSCULAR | Status: DC | PRN

## 2011-10-27 MED ORDER — ONDANSETRON HCL 4 MG/2ML IJ SOLN
INTRAMUSCULAR | Status: AC
  Filled 2011-10-27: qty 2

## 2011-10-27 MED ORDER — MENTHOL 3 MG MT LOZG: 1.0000 | LOZENGE | OROMUCOSAL | Status: DC | PRN

## 2011-10-27 MED ORDER — SIMETHICONE 80 MG PO CHEW
80.0000 mg | CHEWABLE_TABLET | Freq: Three times a day (TID) | ORAL | Status: DC
  Administered 2011-10-27 – 2011-10-30 (×9): 80 mg via ORAL

## 2011-10-27 MED ORDER — ZOLPIDEM TARTRATE 5 MG PO TABS: 5.0000 mg | ORAL_TABLET | Freq: Every evening | ORAL | Status: DC | PRN

## 2011-10-27 MED ORDER — SCOPOLAMINE 1 MG/3DAYS TD PT72
MEDICATED_PATCH | TRANSDERMAL | Status: AC
  Administered 2011-10-27: 1.5 mg
  Filled 2011-10-27: qty 1

## 2011-10-27 MED ORDER — PHENYLEPHRINE HCL 10 MG/ML IJ SOLN
INTRAMUSCULAR | Status: DC | PRN
  Administered 2011-10-27 (×4): 40 ug via INTRAVENOUS

## 2011-10-27 MED ORDER — IBUPROFEN 600 MG PO TABS
600.0000 mg | ORAL_TABLET | Freq: Four times a day (QID) | ORAL | Status: DC | PRN
  Filled 2011-10-27 (×10): qty 1

## 2011-10-27 MED ORDER — OXYCODONE-ACETAMINOPHEN 5-325 MG PO TABS
1.0000 | ORAL_TABLET | ORAL | Status: DC | PRN
  Administered 2011-10-28 – 2011-10-30 (×4): 1 via ORAL
  Filled 2011-10-27 (×4): qty 1

## 2011-10-27 MED ORDER — ONDANSETRON HCL 4 MG PO TABS: 4.0000 mg | ORAL_TABLET | ORAL | Status: DC | PRN

## 2011-10-27 MED ORDER — KETOROLAC TROMETHAMINE 30 MG/ML IJ SOLN
30.0000 mg | Freq: Four times a day (QID) | INTRAMUSCULAR | Status: AC | PRN
  Administered 2011-10-27 (×2): 30 mg via INTRAVENOUS
  Filled 2011-10-27: qty 1

## 2011-10-27 MED ORDER — LANOLIN HYDROUS EX OINT: 1.0000 "application " | TOPICAL_OINTMENT | CUTANEOUS | Status: DC | PRN

## 2011-10-27 MED ORDER — MORPHINE SULFATE (PF) 0.5 MG/ML IJ SOLN
INTRAMUSCULAR | Status: DC | PRN
  Administered 2011-10-27: 1 mg via INTRAVENOUS

## 2011-10-27 MED ORDER — OXYTOCIN 40 UNITS IN LACTATED RINGERS INFUSION - SIMPLE MED: 62.5000 mL/h | INTRAVENOUS | Status: AC

## 2011-10-27 MED ORDER — EPHEDRINE 5 MG/ML INJ
INTRAVENOUS | Status: AC
  Filled 2011-10-27: qty 10

## 2011-10-27 MED ORDER — ONDANSETRON HCL 4 MG/2ML IJ SOLN: 4.0000 mg | INTRAMUSCULAR | Status: DC | PRN

## 2011-10-27 MED ORDER — KETOROLAC TROMETHAMINE 30 MG/ML IJ SOLN
INTRAMUSCULAR | Status: AC
  Administered 2011-10-27: 30 mg via INTRAVENOUS
  Filled 2011-10-27: qty 1

## 2011-10-27 MED ORDER — SIMETHICONE 80 MG PO CHEW: 80.0000 mg | CHEWABLE_TABLET | ORAL | Status: DC | PRN

## 2011-10-27 MED ORDER — SODIUM CHLORIDE 0.9 % IR SOLN
Status: DC | PRN
  Administered 2011-10-27: 1000 mL

## 2011-10-27 MED ORDER — TETANUS-DIPHTH-ACELL PERTUSSIS 5-2.5-18.5 LF-MCG/0.5 IM SUSP
0.5000 mL | Freq: Once | INTRAMUSCULAR | Status: AC
  Administered 2011-10-28: 0.5 mL via INTRAMUSCULAR
  Filled 2011-10-27: qty 0.5

## 2011-10-27 MED ORDER — FENTANYL CITRATE 0.05 MG/ML IJ SOLN: 25.0000 ug | INTRAMUSCULAR | Status: DC | PRN

## 2011-10-27 MED ORDER — SODIUM BICARBONATE 8.4 % IV SOLN
INTRAVENOUS | Status: DC | PRN
  Administered 2011-10-27: 5 mL via EPIDURAL

## 2011-10-27 MED ORDER — LACTATED RINGERS IV SOLN
INTRAVENOUS | Status: DC | PRN
Start: 2011-10-27 — End: 2011-10-27
  Administered 2011-10-27: 10:00:00 via INTRAVENOUS

## 2011-10-27 MED ORDER — NALBUPHINE HCL 10 MG/ML IJ SOLN
5.0000 mg | INTRAMUSCULAR | Status: DC | PRN
  Filled 2011-10-27: qty 1

## 2011-10-27 MED ORDER — SODIUM BICARBONATE 8.4 % IV SOLN
INTRAVENOUS | Status: DC | PRN
  Administered 2011-10-27: 20 mL via EPIDURAL

## 2011-10-27 MED ORDER — LIDOCAINE-EPINEPHRINE (PF) 2 %-1:200000 IJ SOLN
INTRAMUSCULAR | Status: AC
  Filled 2011-10-27: qty 20

## 2011-10-27 MED ORDER — SODIUM CHLORIDE 0.9 % IJ SOLN: 3.0000 mL | INTRAMUSCULAR | Status: DC | PRN

## 2011-10-27 MED ORDER — CHLOROPROCAINE HCL 3 % IJ SOLN
INTRAMUSCULAR | Status: AC
  Filled 2011-10-27: qty 20

## 2011-10-27 MED ORDER — LIDOCAINE HCL (PF) 1 % IJ SOLN
INTRAMUSCULAR | Status: DC | PRN
  Administered 2011-10-27 (×2): 4 mL
  Administered 2011-10-27: 2 mL
  Administered 2011-10-27: 4 mL

## 2011-10-27 MED ORDER — DIPHENHYDRAMINE HCL 50 MG/ML IJ SOLN: 25.0000 mg | INTRAMUSCULAR | Status: DC | PRN

## 2011-10-27 MED ORDER — DIPHENHYDRAMINE HCL 25 MG PO CAPS: 25.0000 mg | ORAL_CAPSULE | ORAL | Status: DC | PRN

## 2011-10-27 MED ORDER — SODIUM BICARBONATE 8.4 % IV SOLN
INTRAVENOUS | Status: AC
  Filled 2011-10-27: qty 50

## 2011-10-27 MED ORDER — DIPHENHYDRAMINE HCL 50 MG/ML IJ SOLN: 12.5000 mg | INTRAMUSCULAR | Status: DC | PRN

## 2011-10-27 MED ORDER — EPHEDRINE SULFATE 50 MG/ML IJ SOLN
INTRAMUSCULAR | Status: DC | PRN
  Administered 2011-10-27 (×5): 10 mg via INTRAVENOUS

## 2011-10-27 MED ORDER — SODIUM CHLORIDE 0.9 % IV SOLN
3.0000 g | Freq: Four times a day (QID) | INTRAVENOUS | Status: DC
  Administered 2011-10-27 (×2): 3 g via INTRAVENOUS
  Filled 2011-10-27 (×3): qty 3

## 2011-10-27 MED ORDER — WITCH HAZEL-GLYCERIN EX PADS: 1.0000 "application " | MEDICATED_PAD | CUTANEOUS | Status: DC | PRN

## 2011-10-27 MED ORDER — LACTATED RINGERS IV SOLN
INTRAVENOUS | Status: DC | PRN
  Administered 2011-10-27 (×3): via INTRAVENOUS

## 2011-10-27 MED ORDER — CEFAZOLIN SODIUM-DEXTROSE 2-3 GM-% IV SOLR
INTRAVENOUS | Status: AC
  Filled 2011-10-27: qty 50

## 2011-10-27 MED ORDER — MORPHINE SULFATE (PF) 0.5 MG/ML IJ SOLN
INTRAMUSCULAR | Status: DC | PRN
  Administered 2011-10-27: 4 mg via EPIDURAL

## 2011-10-27 MED ORDER — CEFAZOLIN SODIUM-DEXTROSE 2-3 GM-% IV SOLR
INTRAVENOUS | Status: DC | PRN
  Administered 2011-10-27: 1 g via INTRAVENOUS
  Administered 2011-10-27: 2 g via INTRAVENOUS

## 2011-10-27 MED ORDER — LACTATED RINGERS IV SOLN: INTRAVENOUS | Status: DC

## 2011-10-27 MED ORDER — CEFAZOLIN SODIUM-DEXTROSE 2-3 GM-% IV SOLR: 2.0000 g | INTRAVENOUS | Status: DC

## 2011-10-27 MED ORDER — METOCLOPRAMIDE HCL 5 MG/ML IJ SOLN: 10.0000 mg | Freq: Three times a day (TID) | INTRAMUSCULAR | Status: DC | PRN

## 2011-10-27 MED ORDER — BUPIVACAINE HCL (PF) 0.25 % IJ SOLN
INTRAMUSCULAR | Status: DC | PRN
  Administered 2011-10-27: 10 mL

## 2011-10-27 MED ORDER — MORPHINE SULFATE 0.5 MG/ML IJ SOLN
INTRAMUSCULAR | Status: AC
  Filled 2011-10-27: qty 10

## 2011-10-27 MED ORDER — SCOPOLAMINE 1 MG/3DAYS TD PT72: 1.0000 | MEDICATED_PATCH | Freq: Once | TRANSDERMAL | Status: DC

## 2011-10-27 MED ORDER — BUPIVACAINE HCL (PF) 0.25 % IJ SOLN
INTRAMUSCULAR | Status: AC
  Filled 2011-10-27: qty 30

## 2011-10-27 MED ORDER — PRENATAL MULTIVITAMIN CH
1.0000 | ORAL_TABLET | Freq: Every day | ORAL | Status: DC
  Administered 2011-10-28 – 2011-10-30 (×3): 1 via ORAL
  Filled 2011-10-27 (×3): qty 1

## 2011-10-27 MED ORDER — ONDANSETRON HCL 4 MG/2ML IJ SOLN: 4.0000 mg | Freq: Three times a day (TID) | INTRAMUSCULAR | Status: DC | PRN

## 2011-10-27 MED ORDER — DIPHENHYDRAMINE HCL 25 MG PO CAPS: 25.0000 mg | ORAL_CAPSULE | Freq: Four times a day (QID) | ORAL | Status: DC | PRN

## 2011-10-27 MED ORDER — MAGNESIUM HYDROXIDE 400 MG/5ML PO SUSP: 30.0000 mL | ORAL | Status: DC | PRN

## 2011-10-27 MED ORDER — OXYTOCIN 10 UNIT/ML IJ SOLN
INTRAMUSCULAR | Status: AC
  Filled 2011-10-27: qty 4

## 2011-10-27 MED ORDER — PHENYLEPHRINE 40 MCG/ML (10ML) SYRINGE FOR IV PUSH (FOR BLOOD PRESSURE SUPPORT)
PREFILLED_SYRINGE | INTRAVENOUS | Status: AC
  Filled 2011-10-27: qty 5

## 2011-10-27 MED ORDER — OXYTOCIN 10 UNIT/ML IJ SOLN
40.0000 [IU] | INTRAVENOUS | Status: DC | PRN
  Administered 2011-10-27: 40 [IU] via INTRAVENOUS

## 2011-10-27 MED ORDER — MEPERIDINE HCL 25 MG/ML IJ SOLN: 6.2500 mg | INTRAMUSCULAR | Status: DC | PRN

## 2011-10-27 MED ORDER — SODIUM CHLORIDE 0.9 % IV SOLN
1.0000 ug/kg/h | INTRAVENOUS | Status: DC | PRN
  Filled 2011-10-27: qty 2.5

## 2011-10-27 MED ORDER — CEFAZOLIN SODIUM 1-5 GM-% IV SOLN
1.0000 g | Freq: Once | INTRAVENOUS | Status: DC
  Filled 2011-10-27: qty 50

## 2011-10-27 MED ORDER — KETOROLAC TROMETHAMINE 30 MG/ML IJ SOLN: 30.0000 mg | Freq: Four times a day (QID) | INTRAMUSCULAR | Status: AC | PRN

## 2011-10-27 MED ORDER — SENNOSIDES-DOCUSATE SODIUM 8.6-50 MG PO TABS
2.0000 | ORAL_TABLET | Freq: Every day | ORAL | Status: DC
  Administered 2011-10-27 – 2011-10-29 (×3): 2 via ORAL

## 2011-10-27 SURGICAL SUPPLY — 37 items
CLOTH BEACON ORANGE TIMEOUT ST (SAFETY) ×2 IMPLANT
CONTAINER PREFILL 10% NBF 15ML (MISCELLANEOUS) IMPLANT
DRAPE SURG 17X23 STRL (DRAPES) ×2 IMPLANT
DRESSING TELFA 8X3 (GAUZE/BANDAGES/DRESSINGS) ×1 IMPLANT
DRSG COVADERM 4X10 (GAUZE/BANDAGES/DRESSINGS) ×1 IMPLANT
DURAPREP 26ML APPLICATOR (WOUND CARE) ×2 IMPLANT
ELECT REM PT RETURN 9FT ADLT (ELECTROSURGICAL) ×2
ELECTRODE REM PT RTRN 9FT ADLT (ELECTROSURGICAL) ×1 IMPLANT
EXTRACTOR VACUUM M CUP 4 TUBE (SUCTIONS) IMPLANT
GLOVE BIO SURGEON STRL SZ 6.5 (GLOVE) ×4 IMPLANT
GLOVE BIOGEL PI IND STRL 7.0 (GLOVE) ×1 IMPLANT
GLOVE BIOGEL PI INDICATOR 7.0 (GLOVE) ×1
GOWN PREVENTION PLUS LG XLONG (DISPOSABLE) ×6 IMPLANT
KIT ABG SYR 3ML LUER SLIP (SYRINGE) IMPLANT
NDL HYPO 25X5/8 SAFETYGLIDE (NEEDLE) ×1 IMPLANT
NEEDLE HYPO 22GX1.5 SAFETY (NEEDLE) ×2 IMPLANT
NEEDLE HYPO 25X5/8 SAFETYGLIDE (NEEDLE) IMPLANT
PACK C SECTION WH (CUSTOM PROCEDURE TRAY) ×2 IMPLANT
PAD ABD 7.5X8 STRL (GAUZE/BANDAGES/DRESSINGS) ×1 IMPLANT
PAD OB MATERNITY 4.3X12.25 (PERSONAL CARE ITEMS) ×1 IMPLANT
RTRCTR C-SECT PINK 25CM LRG (MISCELLANEOUS) IMPLANT
SLEEVE SCD COMPRESS KNEE MED (MISCELLANEOUS) IMPLANT
STAPLER VISISTAT 35W (STAPLE) IMPLANT
SUT PLAIN 0 NONE (SUTURE) IMPLANT
SUT VIC AB 0 CT1 27 (SUTURE) ×4
SUT VIC AB 0 CT1 27XBRD ANBCTR (SUTURE) ×2 IMPLANT
SUT VIC AB 0 CTX 36 (SUTURE) ×6
SUT VIC AB 0 CTX36XBRD ANBCTRL (SUTURE) ×2 IMPLANT
SUT VIC AB 2-0 CT1 27 (SUTURE) ×2
SUT VIC AB 2-0 CT1 TAPERPNT 27 (SUTURE) ×1 IMPLANT
SUT VIC AB 3-0 SH 27 (SUTURE)
SUT VIC AB 3-0 SH 27X BRD (SUTURE) IMPLANT
SYR CONTROL 10ML LL (SYRINGE) ×2 IMPLANT
TAPE CLOTH SURG 4X10 WHT LF (GAUZE/BANDAGES/DRESSINGS) ×1 IMPLANT
TOWEL OR 17X24 6PK STRL BLUE (TOWEL DISPOSABLE) ×4 IMPLANT
TRAY FOLEY CATH 14FR (SET/KITS/TRAYS/PACK) ×1 IMPLANT
WATER STERILE IRR 1000ML POUR (IV SOLUTION) ×3 IMPLANT

## 2011-10-27 NOTE — Anesthesia Postprocedure Evaluation (Signed)
  Anesthesia Post-op Note  Patient: Victoria Knight  Procedure(s) Performed: Procedure(s) (LRB) with comments: CESAREAN SECTION (N/A) - Primary cesarean section with delivery of baby boy at 63. Apgars  9/9.  Patient Location: PACU  Anesthesia Type: Epidural  Level of Consciousness: awake, alert  and oriented  Airway and Oxygen Therapy: Patient Spontanous Breathing  Post-op Pain: none  Post-op Assessment: Post-op Vital signs reviewed, Patient's Cardiovascular Status Stable, Respiratory Function Stable, Patent Airway, No signs of Nausea or vomiting, Pain level controlled, No headache and No backache  Post-op Vital Signs: Reviewed and stable  Complications: No apparent anesthesia complications

## 2011-10-27 NOTE — Progress Notes (Signed)
Pitocin off - amnioinfusion stopped per d. Paul cnm

## 2011-10-27 NOTE — Progress Notes (Signed)
FSE replaces by CNM

## 2011-10-27 NOTE — Progress Notes (Signed)
Subjective: Doing well, pain controled with epidural, UCs q2-3 min.  Pito d/ced.   Objective: BP 114/58  Pulse 70  Temp 98.1 F (36.7 C) (Axillary)  Resp 20  Ht 5\' 1"  (1.549 m)  Wt 126.1 kg (278 lb)  BMI 52.53 kg/m2  SpO2 100%  LMP 01/21/2011   FHT:  FHR: 130 bpm, variability: moderate,  accelerations:  Present,  decelerations:  Present variables with UCs, now resolving off Pitocin UC:   regular, every 3 minutes VE:   Dilation: 4 Effacement (%): 80 Station: -3 Exam by:: dr Seymour Bars  No progression overnight with adequate UCs.  Very high presentation, not engaged.  Assessment / Plan: Term SROM, induced with Pitocin.  Failure to progress in dilation and descent in spite of adequate UCs.  F-well being reassuring.  Urgent primary C/S.  Surgery and risks reviewed thoroughly, including hemorrhage/Blood transfusion, trauma, infection, DVT/PE, Anesthesia Cx.   Fetal Wellbeing:  Category I Pain Control:  Epidural  Anticipated MOD:  C/S  Victoria Knight,Victoria Knight 10/27/2011, 9:02 AM

## 2011-10-27 NOTE — Addendum Note (Signed)
Addendum  created 10/27/11 2016 by Gertie Fey, CRNA   Modules edited:Notes Section

## 2011-10-27 NOTE — OR Nursing (Addendum)
Uterus massaged by S. Karsten Howry . Two tubes of cord blood to lab. Foley in upon arrival to OR.  Urine color -concentrated.  15cc of blood evacuated from uterus during uterine massage.

## 2011-10-27 NOTE — Anesthesia Procedure Notes (Signed)
Epidural Patient location during procedure: OB Start time: 10/27/2011 12:37 AM  Staffing Performed by: anesthesiologist   Preanesthetic Checklist Completed: patient identified, site marked, surgical consent, pre-op evaluation, timeout performed, IV checked, risks and benefits discussed and monitors and equipment checked  Epidural Patient position: sitting Prep: site prepped and draped and DuraPrep Patient monitoring: continuous pulse ox and blood pressure Approach: midline Injection technique: LOR air  Needle:  Needle type: Tuohy  Needle gauge: 17 G Needle length: 9 cm and 9 Needle insertion depth: 8 cm Catheter type: closed end flexible Catheter size: 19 Gauge Catheter at skin depth: 13 cm Test dose: negative  Assessment Events: blood not aspirated, injection not painful, no injection resistance, negative IV test and no paresthesia  Additional Notes Discussed risk of headache, infection, bleeding, nerve injury and failed or incomplete block.  Patient voices understanding and wishes to proceed. Reason for block:procedure for pain

## 2011-10-27 NOTE — Transfer of Care (Signed)
Immediate Anesthesia Transfer of Care Note  Patient: Victoria Knight  Procedure(s) Performed: Procedure(s) (LRB) with comments: CESAREAN SECTION (N/A) - Primary cesarean section with delivery of baby boy at 78. Apgars  9/9.  Patient Location: PACU  Anesthesia Type: Epidural  Level of Consciousness: awake, alert  and oriented  Airway & Oxygen Therapy: Patient Spontanous Breathing  Post-op Assessment: Report given to PACU RN and Post -op Vital signs reviewed and stable  Post vital signs: Reviewed and stable  Complications: No apparent anesthesia complications

## 2011-10-27 NOTE — Progress Notes (Signed)
Victoria Knight is a 26 y.o. G1P0000 at [redacted]w[redacted]d by ultrasound admitted for PROM  Subjective: Contractions painful. Paged to room to assess FHR tracing. Difficult to monitor ctx and FHR d/t body habitus.  IUPC and FSE placed.  Objective: Filed Vitals:   10/26/11 2202 10/26/11 2232 10/26/11 2302 10/26/11 2332  BP: 105/53 115/55 112/55 106/53  Pulse: 72 78 77 77  Temp:    98.1 F (36.7 C)  TempSrc:    Oral  Resp: 18 18 20 18   Height:      Weight:      SpO2:        FHT:  FHR: 145 bpm, variability: moderate,  accelerations:  Present,  decelerations:  Present variable 15 BPM bellow baseline with quick recovery to BL UC:   regular, every 2-3 minutes, MVU 225 SVE:   Dilation: 2.5 Effacement (%): 80 Station: -1 Exam by:: Renae Fickle, CNM Vertex  Labs:   Basename 10/26/11 2105  WBC 8.5  HGB 10.6*  HCT 32.0*  PLT 207    Assessment / Plan: Induction of labor due to PROM,  progressing well on pitocin  Labor: laten early, entering active  Preeclampsia:  no signs or symptoms of toxicity Fetal Wellbeing:  Category I and Category II Pain Control:  epidural now I/D:  Afebrile, ROM x 13 hours, starting Unasyn 3 gm Q 6 hrs prophylaxys Anticipated MOD:  NSVD  PAUL,DANIELA 10/27/2011, 12:28 AM

## 2011-10-27 NOTE — Progress Notes (Signed)
CNM updated on pt status and CNM is ok with the FHR strip for now as long as there id good variability.  CNM also told IUPC is not reading well.  Orders to re-zero and adjust. If IUPC will not CNM stated she will replace it.

## 2011-10-27 NOTE — Progress Notes (Signed)
S: Amnioinfusion to relieve variable decel's w/ late return ongoing for past hour. Pitocin briefly attempted prior to this but has been off for past hour.  Comfortable with epidural.  O: Filed Vitals:   10/27/11 0152 10/27/11 0202 10/27/11 0232 10/27/11 0302  BP:  132/66 118/77 133/70  Pulse: 80 78 80 72  Temp:      TempSrc:      Resp:  20 18 20   Height:      Weight:      SpO2: 100%        FHT:  FHR: 135 bpm, variability: moderate,  accelerations:  Present,  decelerations:  Present ocasional variable to 100 bpm with slow return to BL UC:   regular, every 2-3 minutes, MVU 130-170 SVE:   unchanged   A / P: Protracted latent phase, inadequate ctx strength Afebrile, Unasyn prophylaxis  Fetal Wellbeing:  Category I and Category II Pain Control:  Epidural  Anticipated MOD:  will reattempt Pitocin as fetal heart rate improved with amnioinfusion  Saranne Crislip 10/27/2011, 4:07 AM

## 2011-10-27 NOTE — Anesthesia Postprocedure Evaluation (Signed)
  Anesthesia Post-op Note  Patient: Victoria Knight  Procedure(s) Performed: Procedure(s) (LRB) with comments: CESAREAN SECTION (N/A) - Primary cesarean section with delivery of baby boy at 59. Apgars  9/9.  Patient Location: PACU and Mother/Baby  Anesthesia Type: Epidural  Level of Consciousness: awake, alert  and oriented  Airway and Oxygen Therapy: Patient Spontanous Breathing  Post-op Pain: none  Post-op Assessment: Post-op Vital signs reviewed  Post-op Vital Signs: Reviewed and stable  Complications: No apparent anesthesia complications

## 2011-10-27 NOTE — Progress Notes (Signed)
S: Pitocin off for past hour, FHR decel's late and variables with post epidural hypotension, s/p ephedrine x 2. Comfortable with epidural.   O: Filed Vitals:   10/27/11 0149 10/27/11 0150 10/27/11 0151 10/27/11 0152  BP:      Pulse: 80 80 80 80  Temp:      TempSrc:      Resp:      Height:      Weight:      SpO2: 100% 100% 100% 100%     FHT:  FHR: 135 bpm, variability: moderate,  accelerations:  Present,  decelerations:  Absent UC:   regular, every 3-4 minutes, MVU 120-140 SVE:   Dilation: 3 Effacement (%): 80 Station: -1 Exam by:: H.Norton, RN    A / P: Protracted latent phase Afebrile, Unasyn prophylaxis for prolonged ROM. Fetal Wellbeing:  Category I Pain Control:  Epidural  Anticipated MOD:  will restart Pitocin at 2 mu/min, cautious increase, titrate MVU 200   Cynthya Yam 10/27/2011, 2:01 AM

## 2011-10-27 NOTE — Anesthesia Preprocedure Evaluation (Addendum)
Anesthesia Evaluation  Patient identified by MRN, date of birth, ID band Patient awake    Reviewed: Allergy & Precautions, H&P , NPO status , Patient's Chart, lab work & pertinent test results, reviewed documented beta blocker date and time   History of Anesthesia Complications Negative for: history of anesthetic complications  Airway Mallampati: III TM Distance: >3 FB Neck ROM: full    Dental  (+) Teeth Intact   Pulmonary neg pulmonary ROS,  breath sounds clear to auscultation        Cardiovascular negative cardio ROS  Rhythm:regular Rate:Normal     Neuro/Psych PSYCHIATRIC DISORDERS (bipolar 1, anxiety) negative neurological ROS     GI/Hepatic negative GI ROS, Neg liver ROS,   Endo/Other  Morbid obesity  Renal/GU negative Renal ROS     Musculoskeletal   Abdominal   Peds  Hematology negative hematology ROS (+)   Anesthesia Other Findings   Reproductive/Obstetrics (+) Pregnancy                           Anesthesia Physical Anesthesia Plan  ASA: III and Emergent  Anesthesia Plan: Epidural   Post-op Pain Management:    Induction:   Airway Management Planned: Natural Airway  Additional Equipment:   Intra-op Plan:   Post-operative Plan:   Informed Consent: I have reviewed the patients History and Physical, chart, labs and discussed the procedure including the risks, benefits and alternatives for the proposed anesthesia with the patient or authorized representative who has indicated his/her understanding and acceptance.   Dental advisory given  Plan Discussed with: Anesthesiologist, CRNA and Surgeon  Anesthesia Plan Comments:        Anesthesia Quick Evaluation

## 2011-10-27 NOTE — Progress Notes (Signed)
S: Pain well controlled with  Epidural, + pelvic pressure with some ctx.   OCeasar Mons Vitals:   10/27/11 0631 10/27/11 0632 10/27/11 0702 10/27/11 0732  BP: 111/67 111/67 122/90 122/68  Pulse: 70 70 81 69  Temp:      TempSrc:      Resp: 20 20 18 20   Height:      Weight:      SpO2:         FHT:  FHR: 140 bpm, variability: moderate,  accelerations:  Present,  decelerations:  Present variables, small  UC:   irregular, every 1.5-4 minutes, MVU 215, adequate for past 2.5 hours  SVE:   Dilation: 4 Effacement (%): 80 / swelling Station: -1 Exam by:: Renae Fickle, CNM Small caput    A / P: Arrest in active phase of labor  Fetal Wellbeing:  Category I Pain Control:  Epidural  Anticipated MOD:  No change in cervix despite adequate ctx for past several hours. Recommend C-section at this time, risks of surgery reviewed with patient - hemorrhage, infection, damage to surrounding organs. Patient understands and agrees. Dr. Seymour Bars notified.  Prep for OR initiated.   PAUL,DANIELA 10/27/2011, 8:01 AM

## 2011-10-28 ENCOUNTER — Encounter (HOSPITAL_COMMUNITY): Payer: Self-pay

## 2011-10-28 DIAGNOSIS — Z98891 History of uterine scar from previous surgery: Secondary | ICD-10-CM

## 2011-10-28 HISTORY — DX: History of uterine scar from previous surgery: Z98.891

## 2011-10-28 LAB — CBC
Hemoglobin: 8.2 g/dL — ABNORMAL LOW (ref 12.0–15.0)
MCV: 80.5 fL (ref 78.0–100.0)
Platelets: 170 10*3/uL (ref 150–400)
RBC: 3.18 MIL/uL — ABNORMAL LOW (ref 3.87–5.11)
WBC: 10.7 10*3/uL — ABNORMAL HIGH (ref 4.0–10.5)

## 2011-10-28 MED ORDER — POLYSACCHARIDE IRON COMPLEX 150 MG PO CAPS
150.0000 mg | ORAL_CAPSULE | Freq: Two times a day (BID) | ORAL | Status: DC
  Administered 2011-10-28 – 2011-10-30 (×4): 150 mg via ORAL
  Filled 2011-10-28 (×5): qty 1

## 2011-10-28 NOTE — Addendum Note (Signed)
Addendum  created 10/28/11 0801 by Suella Grove, CRNA   Modules edited:Notes Section

## 2011-10-28 NOTE — Progress Notes (Signed)
SW received consult to see MOB for hx of Bipolar and Anxiety.  SW reviewed PNR, which documents an aunt as having a dx of Bipolar, but does not state that MOB has a dx of Bipolar.  SW does not see a hx of Anxiety noted anywhere in MOB or baby's records.  SW spoke to bedside RN who states no concerns at this time and therefore has screened out referral.  SW will see MOB by her request of if issues arise.

## 2011-10-28 NOTE — Progress Notes (Signed)
Subjective: POD# 1 Information for the patient's newborn:  Victoria, Knight [[id number]]  female  / circ planned outpatient  Reports feeling tired, little sleep last night w/ breastfeeding.  Feeding: breast Patient reports tolerating PO.  Breast symptoms: latch improved after LC consult. Pain controlled with Motrin and Percocet.  Denies HA/SOB/C/P/N/V/dizziness. Flatus present. She reports vaginal bleeding as normal, without clots.  She is ambulating, urinating without difficult.     Objective:   VS:  Filed Vitals:   10/27/11 1930 10/27/11 2140 10/28/11 0200 10/28/11 0550  BP: 125/77 136/85 131/74 117/59  Pulse: 95 103 98 93  Temp: 99.4 F (37.4 C) 99.5 F (37.5 C) 99.3 F (37.4 C) 99 F (37.2 C)  TempSrc: Oral Oral Oral Oral  Resp: 20 20 20 20   Height:      Weight:      SpO2: 97% 95% 100% 100%     Intake/Output Summary (Last 24 hours) at 10/28/11 0921 Last data filed at 10/28/11 0550  Gross per 24 hour  Intake   2890 ml  Output   2075 ml  Net    815 ml        Basename 10/28/11 0504 10/26/11 2105  WBC 10.7* 8.5  HGB 8.2* 10.6*  HCT 25.6* 32.0*  PLT 170 207     Blood type: --/--/O POS (10/12 2359)  Rubella: Immune (07/19 0000)     Physical Exam:  General: alert, cooperative and no distress CV: Regular rate and rhythm Resp: clear Abdomen: soft, nontender, obese, decreased bowel sounds Incision: clean, dry, intact and dressing in place Uterine Fundus: firm, below umbilicus, nontender Lochia: minimal Ext: edema +1 pedal and Homans sign is negative, no sign of DVT      Assessment/Plan: 26 y.o.   POD# 1. G1P1001                  Active Problems:  S/P cesarean section (arrest of dialation, 10/13)  Iron deficiency anemia  Postpartum care following cesarean delivery  Doing well, stable.     Anemia asymptomatic, oral Fe supplement restarted          Ambulate, warm PO fluids to increase gut motility Routine post-op care  PAUL,DANIELA 10/28/2011,  9:21 AM

## 2011-10-28 NOTE — Anesthesia Postprocedure Evaluation (Signed)
  Anesthesia Post-op Note  Patient: Victoria Knight  Procedure(s) Performed: Procedure(s) (LRB) with comments: CESAREAN SECTION (N/A) - Primary cesarean section with delivery of baby boy at 30. Apgars  9/9.  Patient Location: Mother/Baby  Anesthesia Type: Epidural  Level of Consciousness: awake, alert  and oriented  Airway and Oxygen Therapy: Patient Spontanous Breathing  Post-op Pain: none  Post-op Assessment: Patient's Cardiovascular Status Stable, Respiratory Function Stable, Patent Airway, No signs of Nausea or vomiting, Adequate PO intake, Pain level controlled, No headache, No backache, No residual numbness and No residual motor weakness  Post-op Vital Signs: Reviewed and stable  Complications: No apparent anesthesia complications

## 2011-10-29 NOTE — Progress Notes (Signed)
Patient ID: Victoria Knight, female   DOB: 1985-08-16, 26 y.o.   MRN: [id number]  POSTOPERATIVE DAY # 2 S/P cesarean section - arrest of labor   S:         Reports feeling more sore today - slight increase in pain             Tolerating po intake / no  nausea / no  vomiting / + flatus / no BM             Bleeding is light             Pain controlled with motrin and percocet - effective             Up ad lib / ambulatory  Newborn breast-feeding  / Circumcision completed   O:  A & O x 3              VS: Blood pressure 111/76, pulse 79, temperature 98.7 F (37.1 C), temperature source Oral, resp. rate 18, height 5\' 1"  (1.549 m), weight 126.1 kg (278 lb), last menstrual period 01/21/2011, SpO2 96.00%, unknown if currently breastfeeding.  Lungs: Clear and unlabored  Heart: regular rate and rhythm   Abdomen: soft, non-tender, non-distended, large panus without edema or erythema, active BS             Fundus: firm, non-tender, U-1             Dressing OFF              Incision:  approximated with staples / no erythema / no ecchymosis / no drainage  Perineum: no edema  Lochia: light  Extremities: 1+ dependent edema, no calf pain or tenderness, negative Homans  A:        POD # 2 S/P cesarean section            Obesity            Iron deficiency anemia  P:        Routine postoperative care              anticipate discharge tomorrow             Ambulate TID              Marlinda Mike CNM, MSN 10/29/2011, 9:16 AM

## 2011-10-29 NOTE — Progress Notes (Signed)
UR chart review completed.  

## 2011-10-30 MED ORDER — IBUPROFEN 600 MG PO TABS: 600.0000 mg | ORAL_TABLET | Freq: Four times a day (QID) | ORAL | Status: DC

## 2011-10-30 MED ORDER — POLYSACCHARIDE IRON COMPLEX 150 MG PO CAPS: 150.0000 mg | ORAL_CAPSULE | Freq: Every day | ORAL | Status: DC

## 2011-10-30 MED ORDER — OXYCODONE-ACETAMINOPHEN 5-325 MG PO TABS: 1.0000 | ORAL_TABLET | ORAL | Status: DC | PRN

## 2011-10-30 NOTE — Discharge Summary (Signed)
Reviewed and agree with note V.Keyia Moretto, MD 

## 2011-10-30 NOTE — Discharge Summary (Signed)
POSTOPERATIVE DISCHARGE SUMMARY:  Patient ID: Victoria Knight MRN: [id number] DOB/AGE: 06/29/1985 26 y.o.  Admit date: 10/26/2011 Discharge date:  10/30/2011  Admission Diagnoses:  39 weeks SROM in latent labor  Discharge Diagnoses:   Term Pregnancy-delivered  POD s/p cesarean section for arrest of labor  Iron deficiency anemia - stable  Prenatal history: G1P1001   EDC : 10/28/2011, by Last Menstrual Period  Prenatal care at [facility]  Primary provider : Mody Prenatal course complicated by obesity / iron deficiency anemia  Prenatal Labs: ABO, Rh:   O positive Antibody: NEG (10/12 2359) Rubella: Immune (07/19 0000)  RPR: NON REACTIVE (10/12 2105)  HBsAg: Negative (07/19 0000)  HIV: Non-reactive (07/19 0000)  GBS: Negative (09/23 0000)  1 hr Glucola : NL   Medical / Surgical History :  Past medical history:  Past Medical History  Diagnosis Date  . SOB (shortness of breath)   . Anemia   . Contact lens/glasses fitting   . PROM (premature rupture of membranes) 10/26/2011  . Normal labor 10/26/2011  . Iron deficiency anemia 10/27/2011  . S/P cesarean section (arrest of dialation, 10/13) 10/28/2011  . Postpartum care following cesarean delivery 10/28/2011    Past surgical history:  Past Surgical History  Procedure Date  . Laparoscopic gastric banding 09/13/08  . Cesarean section 10/27/2011    Procedure: CESAREAN SECTION;  Surgeon: Genia Del, MD;  Location: [facility];  Service: Obstetrics;  Laterality: N/A;  Primary cesarean section with delivery of baby boy at 44. Apgars  9/9.    Family History:  Family History  Problem Relation Age of Onset  . Heart disease Father     congestive heart failure  . Hypertension Father   . Arthritis Father   . Diabetes Father   . Gout Father     Social History:  reports that she has never smoked. She has never used smokeless tobacco. She reports that she does not drink alcohol or use illicit  drugs.   Allergies: Review of patient's allergies indicates no known allergies.    Current Medications at time of admission:  Prior to Admission medications   Medication Sig Start Date End Date Taking? Authorizing Provider  Prenatal Vit-Fe Fumarate-FA (PRENATAL MULTIVITAMIN) TABS Take 1 tablet by mouth daily.   Yes Historical Provider, MD    Intrapartum Course:  Admit for SROM in latent labor Pitocin augmentation Epidural anesthesia Arrest of active labor  Procedures: Cesarean section delivery of female newborn by Dr Seymour Bars  See operative report for further details  Postoperative / postpartum course: uneventful with discharge POD 3  Physical Exam:   VSS: Blood pressure 139/85, pulse 72, temperature 97.9 F (36.6 C), temperature source Oral, resp. rate 18, height 5\' 1"  (1.549 m), weight 126.1 kg (278 lb), last menstrual period 01/21/2011, SpO2 96.00%, unknown if currently breastfeeding.  LABS:   preop hgb 10.6 / postop hgb 8.2  General:pleasant NAD Heart:RR Lungs:clear and unlabored / no SOB Abdomen:soft and non-distended / uterus firm and non-tender Extremities: 1+ dependent edema  Incision:  approximated with staples / no erythema / no ecchymosis / no drainage Staples: intact for removal at [facility] in Friday with BP & dependent edema recheck  Discharge Instructions:  Discharged Condition: good Activity: pelvic rest and postoperative restrictions Diet: routine Medications: see below   Medication List     As of 10/30/2011 11:42 AM    TAKE these medications         ibuprofen 600 MG tablet  Commonly known as: ADVIL,MOTRIN   Take 1 tablet (600 mg total) by mouth every 6 (six) hours.      iron polysaccharides 150 MG capsule   Commonly known as: NIFEREX   Take 1 capsule (150 mg total) by mouth daily.      oxyCODONE-acetaminophen 5-325 MG per tablet   Commonly known as: PERCOCET/ROXICET   Take 1 tablet by mouth every 4 (four) hours as needed (moderate - severe  pain).      prenatal multivitamin Tabs   Take 1 tablet by mouth daily.       Condition: stable Postpartum Instructions: [facility] booklet Discharge to: home Disposition: 01-Home or Self Care Follow up :      Follow-up Information    Follow up with MODY,VAISHALI R, MD. Schedule an appointment as soon as possible for a visit on 11/01/2011. (2:00 Dr. Duffy Rhody)    Contact information:   [address] [place] [address] [phone]        staple removal Friday at [facility] -- call for appointment time Pediatrician apt Dr Duffy Rhody at 2:00pm   Signed: Marlinda Mike CNM, MSN 10/30/2011, 11:42 AM

## 2011-10-30 NOTE — Progress Notes (Signed)
POSTOPERATIVE DAY # 3 S/P cesarean section   S:         Reports feeling well - ready to go home             Tolerating po intake / no nausea / no vomiting / +flatus / no BM             Bleeding is light             Pain controlled with motrin and percocet             Up ad lib / ambulatory  Newborn breast feeding     O:  A & O x 3              VS: Blood pressure 139/85, pulse 72, temperature 97.9 F (36.6 C), temperature source Oral, resp. rate 18, height 5\' 1"  (1.549 m), weight 126.1 kg (278 lb), last menstrual period 01/21/2011, SpO2 96.00%, unknown if currently breastfeeding.  Lungs: Clear and unlabored  Heart: regular rate and rhythm   Abdomen: soft, non-tender, non-distended, active BS             Fundus: firm, non-tender, Ueven             Dressing OFF              Incision:  approximated with staples / no erythema / no ecchymosis / no drainage  Perineum: no edema  Lochia: light spotting  Extremities: 1+ dependent edema, no calf pain or tenderness, negative Homans  A:        POD # 3 S/P cesarean section            Iron deficiency anemia            Dependent edema  P:        Routine postoperative care              discharge to home - [facility] booklet / instructions reviewed             Staples removed at [facility] on Friday   Marlinda Mike CNM, MSN 10/30/2011, 11:36 AM

## 2011-10-30 NOTE — Plan of Care (Signed)
Problem: Discharge Progression Outcomes Goal: Remove staples per MD order Outcome: Not Applicable Date Met:  10/30/11 Will remove in MD office

## 2012-04-23 ENCOUNTER — Telehealth (INDEPENDENT_AMBULATORY_CARE_PROVIDER_SITE_OTHER): Payer: Self-pay | Admitting: Surgery

## 2012-04-23 NOTE — Telephone Encounter (Signed)
04/23/12 lm and mailed recall letter for pt to schedule a bariatric follow-up appt. Dr. Daphine Deutscher did lap band surgery 09/13/08. (lss)

## 2012-09-10 ENCOUNTER — Encounter (INDEPENDENT_AMBULATORY_CARE_PROVIDER_SITE_OTHER): Payer: BC Managed Care – PPO

## 2012-10-08 ENCOUNTER — Encounter (INDEPENDENT_AMBULATORY_CARE_PROVIDER_SITE_OTHER): Payer: Self-pay

## 2012-10-08 ENCOUNTER — Ambulatory Visit (INDEPENDENT_AMBULATORY_CARE_PROVIDER_SITE_OTHER): Payer: BC Managed Care – PPO | Admitting: Physician Assistant

## 2012-10-08 VITALS — BP 122/86 | HR 87 | Temp 96.8°F | Ht 61.0 in | Wt 286.0 lb

## 2012-10-08 DIAGNOSIS — Z4651 Encounter for fitting and adjustment of gastric lap band: Secondary | ICD-10-CM

## 2012-10-08 NOTE — Progress Notes (Signed)
  HISTORY: Victoria Knight is a 27 y.o.female who received an AP-Large lap-band in August 2010 by Dr. Daphine Deutscher. She comes in with close to 27 lbs weight gain since her last appointment in April 2011. Since then she's had a child. Now that he's weaned from breastfeeding, she would like to get back on track with weight loss. She denies regurgitation or reflux symptoms. She does complain of hunger and larger portion sizes however. She would like a fill today.  VITAL SIGNS: Filed Vitals:   10/08/12 1501  BP: 122/86  Pulse: 87  Temp: 96.8 F (36 C)    PHYSICAL EXAM: Physical exam reveals a very well-appearing 27 y.o.female in no apparent distress Neurologic: Awake, alert, oriented Psych: Bright affect, conversant Respiratory: Breathing even and unlabored. No stridor or wheezing Abdomen: Soft, nontender, nondistended to palpation. Incisions well-healed. No incisional hernias. Port easily palpated. Extremities: Atraumatic, good range of motion.  ASSESMENT: 27 y.o.  female  s/p AP-Large lap-band.   PLAN: The patient's port was accessed with a 20G Huber needle without difficulty. Clear fluid was aspirated and 0.5 mL saline was added to the port. The patient was able to swallow water without difficulty following the procedure and was instructed to take clear liquids for the next 24-48 hours and advance slowly as tolerated. There was a fair bit of back pressure on the syringe, so I did not add a full mL as I initially intended. Also, we're going to set her up with [facility] in Nutrition for re-evaluation and counseling.

## 2012-10-08 NOTE — Patient Instructions (Signed)

## 2012-11-05 ENCOUNTER — Ambulatory Visit (INDEPENDENT_AMBULATORY_CARE_PROVIDER_SITE_OTHER): Payer: BC Managed Care – PPO | Admitting: Physician Assistant

## 2012-11-05 ENCOUNTER — Encounter (INDEPENDENT_AMBULATORY_CARE_PROVIDER_SITE_OTHER): Payer: Self-pay

## 2012-11-05 VITALS — BP 120/80 | HR 80 | Temp 97.6°F | Resp 16 | Ht 61.0 in | Wt 278.8 lb

## 2012-11-05 DIAGNOSIS — Z4651 Encounter for fitting and adjustment of gastric lap band: Secondary | ICD-10-CM

## 2012-11-05 NOTE — Patient Instructions (Signed)

## 2012-11-05 NOTE — Progress Notes (Signed)
  HISTORY: Victoria Knight is a 27 y.o.female who received an AP-Large lap-band in August 2010 by Dr. Daphine Deutscher. She comes in with 7 lbs of weight loss since her last visit. She has no complaints of regurgitation or reflux. She has noticed her hunger being under better control but her portion sizes are still bigger than desired. She would like a fill today.  VITAL SIGNS: Filed Vitals:   11/05/12 1541  BP: 120/80  Pulse: 80  Temp: 97.6 F (36.4 C)  Resp: 16    PHYSICAL EXAM: Physical exam reveals a very well-appearing 27 y.o.female in no apparent distress Neurologic: Awake, alert, oriented Psych: Bright affect, conversant Respiratory: Breathing even and unlabored. No stridor or wheezing Abdomen: Soft, nontender, nondistended to palpation. Incisions well-healed. No incisional hernias. Port easily palpated. Extremities: Atraumatic, good range of motion.  ASSESMENT: 27 y.o.  female  s/p AP-Large lap-band.   PLAN: The patient's port was accessed with a 20G Huber needle without difficulty. Clear fluid was aspirated and 0.5 mL saline was added to the port. The patient was able to swallow water without difficulty following the procedure and was instructed to take clear liquids for the next 24-48 hours and advance slowly as tolerated.

## 2012-12-03 ENCOUNTER — Encounter (INDEPENDENT_AMBULATORY_CARE_PROVIDER_SITE_OTHER): Payer: BC Managed Care – PPO

## 2012-12-04 ENCOUNTER — Encounter (INDEPENDENT_AMBULATORY_CARE_PROVIDER_SITE_OTHER): Payer: Self-pay | Admitting: Physician Assistant

## 2013-04-13 ENCOUNTER — Encounter (HOSPITAL_COMMUNITY): Payer: Self-pay | Admitting: Emergency Medicine

## 2013-04-13 ENCOUNTER — Emergency Department (INDEPENDENT_AMBULATORY_CARE_PROVIDER_SITE_OTHER)
Admission: EM | Admit: 2013-04-13 | Discharge: 2013-04-13 | Disposition: A | Discharge status: Home / Self Care | Payer: BC Managed Care – PPO | Source: Home / Self Care | Attending: Family Medicine | Admitting: Family Medicine

## 2013-04-13 DIAGNOSIS — J020 Streptococcal pharyngitis: Secondary | ICD-10-CM

## 2013-04-13 LAB — POCT RAPID STREP A: Streptococcus, Group A Screen (Direct): POSITIVE — AB

## 2013-04-13 MED ORDER — AMOXICILLIN 500 MG PO CAPS: 500.0000 mg | ORAL_CAPSULE | Freq: Three times a day (TID) | ORAL | Status: DC

## 2013-04-13 NOTE — ED Notes (Signed)
Pt  Reports  Symptoms  Of  Body  Aches    Hoarse      Fever         Symptoms   Since  Friday       -    Pt    Reports        Family  Member  Has  Similar  Symptoms

## 2013-04-13 NOTE — ED Provider Notes (Signed)
CSN: [id number]     Arrival date & time 3/31161096045/15  0813 History   First MD Initiated Contact with Patient 04/13/13 [phone]     Chief Complaint  Patient presents with  . Influenza   (Consider location/radiation/quality/duration/timing/severity/associated sxs/prior Treatment) Patient is a 28 y.o. female presenting with flu symptoms. The history is provided by the patient.  Influenza Presenting symptoms: cough, myalgias, nausea and sore throat   Presenting symptoms: no diarrhea, no rhinorrhea and no vomiting   Severity:  Mild Onset quality:  Gradual Duration:  2 days Progression:  Unchanged Chronicity:  New Relieved by:  None tried Worsened by:  Nothing tried Associated symptoms: chills   Risk factors: no sick contacts     Past Medical History  Diagnosis Date  . SOB (shortness of breath)   . Anemia   . Contact lens/glasses fitting   . PROM (premature rupture of membranes) 10/26/2011  . Normal labor 10/26/2011  . Iron deficiency anemia 10/27/2011  . S/P cesarean section (arrest of dialation, 10/13) 10/28/2011  . Postpartum care following cesarean delivery 10/28/2011   Past Surgical History  Procedure Laterality Date  . Laparoscopic gastric banding  09/13/08  . Cesarean section  10/27/2011    Procedure: CESAREAN SECTION;  Surgeon: Genia DelMarie-Lyne Lavoie, MD;  Location: [facility];  Service: Obstetrics;  Laterality: N/A;  Primary cesarean section with delivery of baby boy at [id number]. Apgars  9/9.   Family History  Problem Relation Age of Onset  . Heart disease Father     congestive heart failure  . Hypertension Father   . Arthritis Father   . Diabetes Father   . Gout Father    History  Substance Use Topics  . Smoking status: Never Smoker   . Smokeless tobacco: Never Used  . Alcohol Use: No   OB History   Grav Para Term Preterm Abortions TAB SAB Ect Mult Living   1 1 1  0 0 0 0 0 0 1     Review of Systems  Constitutional: Positive for chills.  HENT: Positive for sore throat.  Negative for rhinorrhea.   Respiratory: Positive for cough.   Gastrointestinal: Positive for nausea. Negative for vomiting and diarrhea.  Musculoskeletal: Positive for myalgias.    Allergies  Review of patient's allergies indicates no known allergies.  Home Medications   Current Outpatient Rx  Name  Route  Sig  Dispense  Refill  . amoxicillin (AMOXIL) 500 MG capsule   Oral   Take 1 capsule (500 mg total) by mouth 3 (three) times daily.   30 capsule   0   . etonogestrel-ethinyl estradiol (NUVARING) 0.12-0.015 MG/24HR vaginal ring   Vaginal   Place 1 each vaginally every 28 (twenty-eight) days. Insert vaginally and leave in place for 3 consecutive weeks, then remove for 1 week.         . iron polysaccharides (NIFEREX) 150 MG capsule   Oral   Take 1 capsule (150 mg total) by mouth daily.   45 capsule   0    BP 150/90  Pulse 88  Temp(Src) 100.3 F (37.9 C)  Resp 14  SpO2 100%  LMP 04/06/2013 Physical Exam  Nursing note and vitals reviewed. Constitutional: She is oriented to person, place, and time. She appears well-developed and well-nourished. No distress.  HENT:  Head: Normocephalic.  Right Ear: External ear normal.  Left Ear: External ear normal.  Mouth/Throat: Oropharynx is clear and moist.  Eyes: Conjunctivae are normal. Pupils are equal, round, and reactive to light.  Neck: Normal range of motion. Neck supple.  Cardiovascular: Normal rate, regular rhythm, normal heart sounds and intact distal pulses.   Pulmonary/Chest: Effort normal and breath sounds normal.  Lymphadenopathy:    She has no cervical adenopathy.  Neurological: She is alert and oriented to person, place, and time.  Skin: Skin is warm and dry.    ED Course  Procedures (including critical care time) Labs Review Labs Reviewed  POCT RAPID STREP A ([facility] ONLY) - Abnormal; Notable for the following:    Streptococcus, Group A Screen (Direct) POSITIVE (*)    All other components within  normal limits   Imaging Review No results found. Strep pos.  MDM   1. Streptococcal sore throat        Linna Hoff, MD 04/14/13 2056

## 2013-04-13 NOTE — Discharge Instructions (Signed)
Drink lots of fluids, take all of medicine, use lozenges as needed.return if needed °

## 2013-11-15 ENCOUNTER — Encounter (HOSPITAL_COMMUNITY): Payer: Self-pay | Admitting: Emergency Medicine

## 2015-03-27 ENCOUNTER — Emergency Department (HOSPITAL_COMMUNITY): Payer: BLUE CROSS/BLUE SHIELD

## 2015-03-27 ENCOUNTER — Emergency Department (HOSPITAL_COMMUNITY)
Admission: EM | Admit: 2015-03-27 | Discharge: 2015-03-28 | Disposition: A | Discharge status: Home / Self Care | Payer: BLUE CROSS/BLUE SHIELD | Attending: Emergency Medicine | Admitting: Emergency Medicine

## 2015-03-27 ENCOUNTER — Encounter (HOSPITAL_COMMUNITY): Payer: Self-pay | Admitting: Family Medicine

## 2015-03-27 DIAGNOSIS — M79671 Pain in right foot: Secondary | ICD-10-CM | POA: Insufficient documentation

## 2015-03-27 DIAGNOSIS — E669 Obesity, unspecified: Secondary | ICD-10-CM | POA: Diagnosis not present

## 2015-03-27 DIAGNOSIS — K2970 Gastritis, unspecified, without bleeding: Secondary | ICD-10-CM | POA: Insufficient documentation

## 2015-03-27 DIAGNOSIS — Z3202 Encounter for pregnancy test, result negative: Secondary | ICD-10-CM | POA: Diagnosis not present

## 2015-03-27 DIAGNOSIS — K209 Esophagitis, unspecified without bleeding: Secondary | ICD-10-CM

## 2015-03-27 DIAGNOSIS — R1013 Epigastric pain: Secondary | ICD-10-CM | POA: Diagnosis present

## 2015-03-27 DIAGNOSIS — Z79899 Other long term (current) drug therapy: Secondary | ICD-10-CM | POA: Diagnosis not present

## 2015-03-27 DIAGNOSIS — D509 Iron deficiency anemia, unspecified: Secondary | ICD-10-CM | POA: Insufficient documentation

## 2015-03-27 LAB — URINALYSIS, ROUTINE W REFLEX MICROSCOPIC
BILIRUBIN URINE: NEGATIVE
Glucose, UA: NEGATIVE mg/dL
Hgb urine dipstick: NEGATIVE
KETONES UR: NEGATIVE mg/dL
LEUKOCYTES UA: NEGATIVE
NITRITE: NEGATIVE
PH: 5.5 (ref 5.0–8.0)
PROTEIN: NEGATIVE mg/dL
Specific Gravity, Urine: 1.034 — ABNORMAL HIGH (ref 1.005–1.030)

## 2015-03-27 LAB — COMPREHENSIVE METABOLIC PANEL
ALBUMIN: 4.1 g/dL (ref 3.5–5.0)
ALT: 20 U/L (ref 14–54)
ANION GAP: 12 (ref 5–15)
AST: 23 U/L (ref 15–41)
Alkaline Phosphatase: 67 U/L (ref 38–126)
BUN: 13 mg/dL (ref 6–20)
CHLORIDE: 106 mmol/L (ref 101–111)
CO2: 20 mmol/L — ABNORMAL LOW (ref 22–32)
Calcium: 9.4 mg/dL (ref 8.9–10.3)
Creatinine, Ser: 0.77 mg/dL (ref 0.44–1.00)
GFR calc Af Amer: >60 mL/min (ref >60)
GFR calc non Af Amer: >60 mL/min (ref >60)
GLUCOSE: 116 mg/dL — AB (ref 65–99)
POTASSIUM: 4.1 mmol/L (ref 3.5–5.1)
SODIUM: 138 mmol/L (ref 135–145)
TOTAL PROTEIN: 8.3 g/dL — AB (ref 6.5–8.1)
Total Bilirubin: 0.2 mg/dL — ABNORMAL LOW (ref 0.3–1.2)

## 2015-03-27 LAB — CBC
HCT: 30.8 % — ABNORMAL LOW (ref 36.0–46.0)
Hemoglobin: 8.9 g/dL — ABNORMAL LOW (ref 12.0–15.0)
MCH: 19.1 pg — ABNORMAL LOW (ref 26.0–34.0)
MCHC: 28.9 g/dL — AB (ref 30.0–36.0)
MCV: 66 fL — ABNORMAL LOW (ref 78.0–100.0)
PLATELETS: 364 10*3/uL (ref 150–400)
RBC: 4.67 MIL/uL (ref 3.87–5.11)
RDW: 20 % — AB (ref 11.5–15.5)
WBC: 10.2 10*3/uL (ref 4.0–10.5)

## 2015-03-27 LAB — I-STAT BETA HCG BLOOD, ED (MC, WL, AP ONLY): I-stat hCG, quantitative: <5 m[IU]/mL (ref <5)

## 2015-03-27 LAB — LIPASE, BLOOD: LIPASE: 43 U/L (ref 11–51)

## 2015-03-27 MED ORDER — HYDROMORPHONE HCL 1 MG/ML IJ SOLN
1.0000 mg | INTRAMUSCULAR | Status: DC | PRN
Start: 2015-03-27 — End: 2015-03-28
  Administered 2015-03-27: 1 mg via INTRAVENOUS
  Filled 2015-03-27 (×2): qty 1

## 2015-03-27 MED ORDER — FENTANYL CITRATE (PF) 100 MCG/2ML IJ SOLN
50.0000 ug | Freq: Once | INTRAMUSCULAR | Status: AC
  Administered 2015-03-27: 50 ug via INTRAVENOUS

## 2015-03-27 MED ORDER — ONDANSETRON HCL 4 MG/2ML IJ SOLN
INTRAMUSCULAR | Status: AC
  Filled 2015-03-27: qty 2

## 2015-03-27 MED ORDER — IOHEXOL 300 MG/ML  SOLN
100.0000 mL | Freq: Once | INTRAMUSCULAR | Status: AC | PRN
  Administered 2015-03-27: 100 mL via INTRAVENOUS

## 2015-03-27 MED ORDER — ONDANSETRON HCL 4 MG/2ML IJ SOLN
4.0000 mg | Freq: Once | INTRAMUSCULAR | Status: AC | PRN
  Administered 2015-03-27: 4 mg via INTRAVENOUS

## 2015-03-27 MED ORDER — FENTANYL CITRATE (PF) 100 MCG/2ML IJ SOLN
INTRAMUSCULAR | Status: AC
  Filled 2015-03-27: qty 2

## 2015-03-27 MED ORDER — ONDANSETRON HCL 4 MG/2ML IJ SOLN
4.0000 mg | Freq: Once | INTRAMUSCULAR | Status: AC
  Administered 2015-03-27: 4 mg via INTRAVENOUS
  Filled 2015-03-27: qty 2

## 2015-03-27 MED ORDER — PANTOPRAZOLE SODIUM 40 MG IV SOLR
40.0000 mg | Freq: Once | INTRAVENOUS | Status: AC
  Administered 2015-03-27: 40 mg via INTRAVENOUS
  Filled 2015-03-27: qty 40

## 2015-03-27 MED ORDER — GI COCKTAIL ~~LOC~~
30.0000 mL | Freq: Once | ORAL | Status: AC
  Administered 2015-03-27: 30 mL via ORAL
  Filled 2015-03-27: qty 30

## 2015-03-27 MED ORDER — FENTANYL CITRATE (PF) 100 MCG/2ML IJ SOLN
100.0000 ug | INTRAMUSCULAR | Status: DC | PRN
  Administered 2015-03-27: 100 ug via INTRAVENOUS
  Filled 2015-03-27 (×2): qty 2

## 2015-03-27 MED ORDER — IOHEXOL 300 MG/ML  SOLN
50.0000 mL | INTRAMUSCULAR | Status: AC
  Administered 2015-03-27 (×2): 50 mL via ORAL

## 2015-03-27 NOTE — ED Provider Notes (Signed)
CSN: [id number]     Arrival date & time 03/27/15  1726 History   First MD Initiated Contact with Patient 03/27/15 2032     Chief Complaint  Patient presents with  . Abdominal Pain     HPI  Patient presents evaluation of epigastric abdominal pain. States it was sudden in onset after lunch today. Had right screen means and chicken. Sudden onset of sharp epigastric pain radiating to her back. No chest pain. No lateralizing pain or flank pain. No shortness of breath. Nausea and vomiting. No hematemesis. No past similar episodes. No history of gallbladder disease liver disease.  Have a history of a gastric band done laparoscopically 2010.  No recent food intolerances. Denies alcohol or tobacco. Does not take excessive anti-inflammatories. Does take iron.    Past Medical History  Diagnosis Date  . SOB (shortness of breath)   . Anemia   . Contact lens/glasses fitting   . PROM (premature rupture of membranes) 10/26/2011  . Normal labor 10/26/2011  . Iron deficiency anemia 10/27/2011  . S/P cesarean section (arrest of dialation, 10/13) 10/28/2011  . Postpartum care following cesarean delivery 10/28/2011   Past Surgical History  Procedure Laterality Date  . Laparoscopic gastric banding  09/13/08  . Cesarean section  10/27/2011    Procedure: CESAREAN SECTION;  Surgeon: Genia DelMarie-Lyne Lavoie, MD;  Location: [facility];  Service: Obstetrics;  Laterality: N/A;  Primary cesarean section with delivery of baby boy at [id number]. Apgars  9/9.   Family History  Problem Relation Age of Onset  . Heart disease Father     congestive heart failure  . Hypertension Father   . Arthritis Father   . Diabetes Father   . Gout Father    Social History  Substance Use Topics  . Smoking status: Never Smoker   . Smokeless tobacco: Never Used  . Alcohol Use: No   OB History    Gravida Para Term Preterm AB TAB SAB Ectopic Multiple Living   1 1 1  0 0 0 0 0 0 1     Review of Systems  Constitutional: Negative for  fever, chills, diaphoresis, appetite change and fatigue.  HENT: Negative for mouth sores, sore throat and trouble swallowing.   Eyes: Negative for visual disturbance.  Respiratory: Negative for cough, chest tightness, shortness of breath and wheezing.   Cardiovascular: Negative for chest pain.  Gastrointestinal: Positive for nausea, vomiting and abdominal pain. Negative for diarrhea and abdominal distention.  Endocrine: Negative for polydipsia, polyphagia and polyuria.  Genitourinary: Negative for dysuria, frequency and hematuria.  Musculoskeletal: Negative for gait problem.  Skin: Negative for color change, pallor and rash.  Neurological: Negative for dizziness, syncope, light-headedness and headaches.  Hematological: Does not bruise/bleed easily.  Psychiatric/Behavioral: Negative for behavioral problems and confusion.      Allergies  Review of patient's allergies indicates no known allergies.  Home Medications   Prior to Admission medications   Medication Sig Start Date End Date Taking? Authorizing Provider  iron polysaccharides (NIFEREX) 150 MG capsule Take 1 capsule (150 mg total) by mouth daily. 10/30/11  Yes Marlinda Mikeanya Bailey, CNM  spironolactone (ALDACTONE) 50 MG tablet Take 50 mg by mouth daily.   Yes Historical Provider, MD  amoxicillin (AMOXIL) 500 MG capsule Take 1 capsule (500 mg total) by mouth 3 (three) times daily. Patient not taking: Reported on 03/27/2015 04/13/13   Linna HoffJames D Kindl, MD  omeprazole (PRILOSEC) 20 MG capsule Take 1 capsule (20 mg total) by mouth 2 (two) times daily. 03/28/15  Rolland Porter, MD  ondansetron (ZOFRAN ODT) 4 MG disintegrating tablet Take 1 tablet (4 mg total) by mouth every 8 (eight) hours as needed for nausea. 03/28/15   Rolland Porter, MD  sucralfate (CARAFATE) 1 GM/10ML suspension Take 10 mLs (1 g total) by mouth 4 (four) times daily -  with meals and at bedtime. 03/28/15   Rolland Porter, MD   BP 141/89 mmHg  Pulse 86  Temp(Src) 98.6 F (37 C) (Oral)   Resp 17  Ht  (1.549 m)  Wt 267 lb (121.11 kg)  BMI 50.48 kg/m2  SpO2 100%  LMP 03/09/2015 Physical Exam  Constitutional: She is oriented to person, place, and time. She appears well-developed and well-nourished. No distress.  Obese female standing next to the bed. Her for head is in the middle of the mattress and she is stepping back and forth from left foot to right foot essentially "rocking" back and forth complaining of pain.  HENT:  Head: Normocephalic.  Eyes: Conjunctivae are normal. Pupils are equal, round, and reactive to light. No scleral icterus.  Neck: Normal range of motion. Neck supple. No thyromegaly present.  Cardiovascular: Normal rate and regular rhythm.  Exam reveals no gallop and no friction rub.   No murmur heard. Pulmonary/Chest: Effort normal and breath sounds normal. No respiratory distress. She has no wheezes. She has no rales.  Abdominal: Soft. Bowel sounds are normal. She exhibits no distension. There is no tenderness. There is no rebound.  Complains of sharp pain in the epigastrium when it is palpated. No palpable mass. No diffuse peritoneal irritation.  Musculoskeletal: Normal range of motion.  Neurological: She is alert and oriented to person, place, and time.  Skin: Skin is warm and dry. No rash noted.  Psychiatric: She has a normal mood and affect. Her behavior is normal.    ED Course  Procedures (including critical care time) Labs Review Labs Reviewed  COMPREHENSIVE METABOLIC PANEL - Abnormal; Notable for the following:    CO2 20 (*)    Glucose, Bld 116 (*)    Total Protein 8.3 (*)    Total Bilirubin 0.2 (*)    All other components within normal limits  CBC - Abnormal; Notable for the following:    Hemoglobin 8.9 (*)    HCT 30.8 (*)    MCV 66.0 (*)    MCH 19.1 (*)    MCHC 28.9 (*)    RDW 20.0 (*)    All other components within normal limits  URINALYSIS, ROUTINE W REFLEX MICROSCOPIC (NOT AT [facility]) - Abnormal; Notable for the following:     APPearance CLOUDY (*)    Specific Gravity, Urine 1.034 (*)    All other components within normal limits  LIPASE, BLOOD  I-STAT BETA HCG BLOOD, ED ([facility], [facility], [facility] ONLY)    Imaging Review Ct Abdomen Pelvis W Contrast  03/27/2015  CLINICAL DATA:  Acute onset of upper abdominal pain, radiating to the back. Nausea and vomiting. Initial encounter. EXAM: CT ABDOMEN AND PELVIS WITH CONTRAST TECHNIQUE: Multidetector CT imaging of the abdomen and pelvis was performed using the standard protocol following bolus administration of intravenous contrast. CONTRAST:  OMNIPAQUE IOHEXOL 300 MG/ML  SOLN COMPARISON:  Pelvic ultrasound performed 07/05/2011, and CT of the abdomen and pelvis performed 03/18/2008 FINDINGS: The visualized lung bases are clear. The patient's gastric banding is grossly unremarkable in appearance. The liver and spleen are unremarkable in appearance. The gallbladder is within normal limits. The pancreas and adrenal glands are unremarkable. The  kidneys are unremarkable in appearance. There is no evidence of hydronephrosis. No renal or ureteral stones are seen. No perinephric stranding is appreciated. No free fluid is identified. The small bowel is unremarkable in appearance. The stomach is within normal limits. No acute vascular abnormalities are seen. The appendix is normal in caliber and contains air, without evidence of appendicitis. The colon is unremarkable in appearance. The bladder is mildly distended and grossly unremarkable. The uterus is unremarkable in appearance. The ovaries are grossly symmetric. No suspicious adnexal masses are seen. No inguinal lymphadenopathy is seen. No acute osseous abnormalities are identified. IMPRESSION: 1. No acute abnormality seen within the abdomen or pelvis. 2. Gastric banding grossly unremarkable in appearance. Electronically Signed   By: Roanna Raider M.D.   On: 03/27/2015 23:40   I have personally reviewed and evaluated these images and lab results as  part of my medical decision-making.   EKG Interpretation None      MDM   Final diagnoses:  Gastritis  Esophagitis    Does not have leukocytosis. Normal hepatobiliary and pancreatic enzymes. Urine without sign of infection or blood. CT scan pending. Minimal relief with Dilaudid. Had gotten short-term relief with fentanyl. Strangely, after drinking her CT contrast she had complete resolution of her pain for approximately 15 minutes and then recurred. Then given fentanyl, and GI cocktail. Off to CT.   CT reassuring. No obvious fluid collections or gross other masses noted in the area of her gastric lap band. No other acute findings noted. Of note, she received some marked but temporary relief with GI cocktail, and with taking the contrast. Very likely gas related phenomenon with gastritis or esophagitis. Plan Prilosec Carafate pain control primary care follow-up.   Rolland Porter, MD 03/28/15 [phone]

## 2015-03-27 NOTE — ED Notes (Signed)
Pt here for sudden onset of abd pain that started this afternoon. sts upper abdomen into back. Sts N,V.

## 2015-03-27 NOTE — ED Notes (Signed)
Pt states her pain resolved when she started drinking the contrast.

## 2015-03-28 MED ORDER — ONDANSETRON 4 MG PO TBDP: 4.0000 mg | ORAL_TABLET | Freq: Three times a day (TID) | ORAL | Status: DC | PRN

## 2015-03-28 MED ORDER — PANTOPRAZOLE SODIUM 40 MG PO TBEC
40.0000 mg | DELAYED_RELEASE_TABLET | Freq: Once | ORAL | Status: AC
  Administered 2015-03-28: 40 mg via ORAL
  Filled 2015-03-28: qty 1

## 2015-03-28 MED ORDER — OMEPRAZOLE 20 MG PO CPDR: 20.0000 mg | DELAYED_RELEASE_CAPSULE | Freq: Two times a day (BID) | ORAL | Status: DC

## 2015-03-28 MED ORDER — SUCRALFATE 1 GM/10ML PO SUSP: 1.0000 g | Freq: Three times a day (TID) | ORAL | Status: DC

## 2015-03-28 NOTE — Discharge Instructions (Signed)
Gastritis, Adult Gastritis is soreness and puffiness (inflammation) of the lining of the stomach. If you do not get help, gastritis can cause bleeding and sores (ulcers) in the stomach. HOME CARE   Only take medicine as told by your doctor.  If you were given antibiotic medicines, take them as told. Finish the medicines even if you start to feel better.  Drink enough fluids to keep your pee (urine) clear or pale yellow.  Avoid foods and drinks that make your problems worse. Foods you may want to avoid include:  Caffeine or alcohol.  Chocolate.  Mint.  Garlic and onions.  Spicy foods.  Citrus fruits, including oranges, lemons, or limes.  Food containing tomatoes, including sauce, chili, salsa, and pizza.  Fried and fatty foods.  Eat small meals throughout the day instead of large meals. GET HELP RIGHT AWAY IF:   You have black or dark red poop (stools).  You throw up (vomit) blood. It may look like coffee grounds.  You cannot keep fluids down.  Your belly (abdominal) pain gets worse.  You have a fever.  You do not feel better after 1 week.  You have any other questions or concerns. MAKE SURE YOU:   Understand these instructions.  Will watch your condition.  Will get help right away if you are not doing well or get worse.   This information is not intended to replace advice given to you by your health care provider. Make sure you discuss any questions you have with your health care provider.   Document Released: 06/19/2007 Document Revised: 03/25/2011 Document Reviewed: 02/13/2011 Elsevier Interactive Patient Education 2016 [employer]  Esophagitis Esophagitis is inflammation of the esophagus. The esophagus is the tube that carries food and liquids from your mouth to your stomach. Esophagitis can cause soreness or pain in the esophagus. This condition can make it difficult and painful to swallow.  CAUSES Most causes of esophagitis are not serious. Common  causes of this condition include:  Gastroesophageal reflux disease (GERD). This is when stomach contents move back up into the esophagus (reflux).  Repeated vomiting.  An allergic-type reaction, especially caused by food allergies (eosinophilic esophagitis).  Injury to the esophagus by swallowing large pills with or without water, or swallowing certain types of medicines.  Swallowing (ingesting) harmful chemicals, such as household cleaning products.  Heavy alcohol use.  An infection of the esophagus.This most often occurs in people who have a weakened immune system.  Radiation or chemotherapy treatment for cancer.  Certain diseases such as sarcoidosis, Crohn disease, and scleroderma. SYMPTOMS Symptoms of this condition include:  Difficult or painful swallowing.  Pain with swallowing acidic liquids, such as citrus juices.  Pain with burping.  Chest pain.  Difficulty breathing.  Nausea.  Vomiting.  Pain in the abdomen.  Weight loss.  Ulcers in the mouth.  Patches of white material in the mouth (candidiasis).  Fever.  Coughing up blood or vomiting blood.  Stool that is black, tarry, or bright red. DIAGNOSIS Your health care provider will take a medical history and perform a physical exam. You may also have other tests, including:  An endoscopy to examine your stomach and esophagus with a small camera.  A test that measures the acidity level in your esophagus.  A test that measures how much pressure is on your esophagus.  A barium swallow or modified barium swallow to show the shape, size, and functioning of your esophagus.  Allergy tests. TREATMENT Treatment for this condition depends on the  cause of your esophagitis. In some cases, steroids or other medicines may be given to help relieve your symptoms or to treat the underlying cause of your condition. You may have to make some lifestyle changes, such as:  Avoiding alcohol.  Quitting  smoking.  Changing your diet.  Exercising.  Changing your sleep habits and your sleep environment. HOME CARE INSTRUCTIONS Take these actions to decrease your discomfort and to help avoid complications. Diet  Follow a diet as recommended by your health care provider. This may involve avoiding foods and drinks such as:  Coffee and tea (with or without caffeine).  Drinks that contain alcohol.  Energy drinks and sports drinks.  Carbonated drinks or sodas.  Chocolate and cocoa.  Peppermint and mint flavorings.  Garlic and onions.  Horseradish.  Spicy and acidic foods, including peppers, chili powder, curry powder, vinegar, hot sauces, and barbecue sauce.  Citrus fruit juices and citrus fruits, such as oranges, lemons, and limes.  Tomato-based foods, such as red sauce, chili, salsa, and pizza with red sauce.  Fried and fatty foods, such as donuts, french fries, potato chips, and high-fat dressings.  High-fat meats, such as hot dogs and fatty cuts of red and white meats, such as rib eye steak, sausage, ham, and bacon.  High-fat dairy items, such as whole milk, butter, and cream cheese.  Eat small, frequent meals instead of large meals.  Avoid drinking large amounts of liquid with your meals.  Avoid eating meals during the 2-3 hours before bedtime.  Avoid lying down right after you eat.  Do not exercise right after you eat.  Avoid foods and drinks that seem to make your symptoms worse. General Instructions  Pay attention to any changes in your symptoms.  Take over-the-counter and prescription medicines only as told by your health care provider. Do not take aspirin, ibuprofen, or other NSAIDs unless your health care provider told you to do so.  If you have trouble taking pills, use a pill splitter to decrease the size of the pill. This will decrease the chance of the pill getting stuck or injuring your esophagus on the way down. Also, drink water after you take a  pill.  Do not use any tobacco products, including cigarettes, chewing tobacco, and e-cigarettes. If you need help quitting, ask your health care provider.  Wear loose-fitting clothing. Do not wear anything tight around your waist that causes pressure on your abdomen.  Raise (elevate) the head of your bed about 6 inches (15 cm).  Try to reduce your stress, such as with yoga or meditation. If you need help reducing stress, ask your health care provider.  If you are overweight, reduce your weight to an amount that is healthy for you. Ask your health care provider for guidance about a safe weight loss goal.  Keep all follow-up visits as told by your health care provider. This is important. SEEK MEDICAL CARE IF:  You have new symptoms.  You have unexplained weight loss.  You have difficulty swallowing, or it hurts to swallow.  You have wheezing or a persistent cough.  Your symptoms do not improve with treatment.  You have frequent heartburn for more than two weeks. SEEK IMMEDIATE MEDICAL CARE IF:  You have severe pain in your arms, neck, jaw, teeth, or back.  You feel sweaty, dizzy, or light-headed.  You have chest pain or shortness of breath.  You vomit and your vomit looks like blood or coffee grounds.  Your stool is bloody or black.  You have a fever.  You cannot swallow, drink, or eat.   This information is not intended to replace advice given to you by your health care provider. Make sure you discuss any questions you have with your health care provider.   Document Released: 02/08/2004 Document Revised: 09/21/2014 Document Reviewed: 04/27/2014 Elsevier Interactive Patient Education [employer]

## 2015-03-28 NOTE — ED Notes (Signed)
Pt stable, ambulatory, states understanding of discharge instructions 

## 2015-04-01 ENCOUNTER — Ambulatory Visit (INDEPENDENT_AMBULATORY_CARE_PROVIDER_SITE_OTHER): Payer: BLUE CROSS/BLUE SHIELD | Admitting: Physician Assistant

## 2015-04-01 VITALS — BP 120/78 | HR 86 | Temp 98.8°F | Resp 14 | Ht 61.0 in | Wt 271.0 lb

## 2015-04-01 DIAGNOSIS — R300 Dysuria: Secondary | ICD-10-CM

## 2015-04-01 DIAGNOSIS — B3731 Acute candidiasis of vulva and vagina: Secondary | ICD-10-CM

## 2015-04-01 DIAGNOSIS — K5900 Constipation, unspecified: Secondary | ICD-10-CM

## 2015-04-01 DIAGNOSIS — Z8719 Personal history of other diseases of the digestive system: Secondary | ICD-10-CM | POA: Diagnosis not present

## 2015-04-01 DIAGNOSIS — R718 Other abnormality of red blood cells: Secondary | ICD-10-CM

## 2015-04-01 DIAGNOSIS — B373 Candidiasis of vulva and vagina: Secondary | ICD-10-CM | POA: Diagnosis not present

## 2015-04-01 LAB — POCT CBC
GRANULOCYTE PERCENT: 72.6 % (ref 37–80)
HEMATOCRIT: 26.4 % — AB (ref 37.7–47.9)
HEMOGLOBIN: 8.4 g/dL — AB (ref 12.2–16.2)
Lymph, poc: 1.4 (ref 0.6–3.4)
MCH: 20.2 pg — AB (ref 27–31.2)
MCHC: 32 g/dL (ref 31.8–35.4)
MCV: 63.2 fL — AB (ref 80–97)
MID (cbc): 0.5 (ref 0–0.9)
MPV: 8.1 fL (ref 0–99.8)
PLATELET COUNT, POC: 354 10*3/uL (ref 142–424)
POC GRANULOCYTE: 5.1 (ref 2–6.9)
POC LYMPH %: 20 % (ref 10–50)
POC MID %: 7.4 %M (ref 0–12)
RBC: 4.18 M/uL (ref 4.04–5.48)
RDW, POC: 20.5 %
WBC: 7 10*3/uL (ref 4.6–10.2)

## 2015-04-01 LAB — POC MICROSCOPIC URINALYSIS (UMFC): Mucus: ABSENT

## 2015-04-01 LAB — POCT URINALYSIS DIP (MANUAL ENTRY)
Glucose, UA: NEGATIVE
Ketones, POC UA: NEGATIVE
LEUKOCYTES UA: NEGATIVE
NITRITE UA: NEGATIVE
PH UA: 5.5
Protein Ur, POC: 30 — AB
RBC UA: NEGATIVE
Spec Grav, UA: 1.02
UROBILINOGEN UA: 2

## 2015-04-01 LAB — POCT URINE PREGNANCY: PREG TEST UR: NEGATIVE

## 2015-04-01 MED ORDER — FLUCONAZOLE 150 MG PO TABS: 150.0000 mg | ORAL_TABLET | Freq: Once | ORAL | Status: DC

## 2015-04-01 MED ORDER — RANITIDINE HCL 150 MG PO TABS: 150.0000 mg | ORAL_TABLET | Freq: Two times a day (BID) | ORAL | Status: DC

## 2015-04-01 MED ORDER — POLYETHYLENE GLYCOL 3350 17 GM/SCOOP PO POWD: ORAL | Status: DC

## 2015-04-01 NOTE — Patient Instructions (Signed)
     IF you received an x-ray today, you will receive an invoice from [facility]. Please contact [facility] at [phone] with questions or concerns regarding your invoice.   IF you received labwork today, you will receive an invoice from [facility]. Please contact [facility] at [phone] with questions or concerns regarding your invoice.   Our billing staff will not be able to assist you with questions regarding bills from these companies.  You will be contacted with the lab results as soon as they are available. The fastest way to get your results is to activate your My Chart account. Instructions are located on the last page of this paperwork. If you have not heard from us regarding the results in 2 weeks, please contact this office.      

## 2015-04-01 NOTE — Progress Notes (Signed)
04/01/2015 11:25 AM   DOB: 1985/05/24 / MRN: [id number]  SUBJECTIVE:  Victoria Knight is a 30 y.o. female undergoing fertility treatment presenting for belly pain and dysuria. Report she was seen at the ED on 3/13 and was treated for GERD. CT of the belly at that time was negative. Today she feels that her symptoms are somewhat different and describes a moderately severe generalized ache.  She reports that she has not been able to defecate all week. She denies burning sensation in the chest, dysphagia, and odynophagia at this time.   She has No Known Allergies.   She  has a past medical history of SOB (shortness of breath); Anemia; Contact lens/glasses fitting; PROM (premature rupture of membranes) (10/26/2011); Normal labor (10/26/2011); Iron deficiency anemia (10/27/2011); S/P cesarean section (arrest of dialation, 10/13) (10/28/2011); and Postpartum care following cesarean delivery (10/28/2011).    She  reports that she has never smoked. She has never used smokeless tobacco. She reports that she does not drink alcohol or use illicit drugs. She  reports that she does not currently engage in sexual activity. The patient  has past surgical history that includes Laparoscopic gastric banding (09/13/08) and Cesarean section (10/27/2011).  Her family history includes Arthritis in her father; Diabetes in her father; Gout in her father; Heart disease in her father; Hypertension in her father.  Review of Systems  Constitutional: Negative for fever.  Respiratory: Negative for cough.   Cardiovascular: Negative for chest pain.  Gastrointestinal: Positive for abdominal pain and constipation. Negative for heartburn, nausea, vomiting, diarrhea, blood in stool and melena.  Musculoskeletal: Negative for myalgias.  Skin: Negative for rash.  Neurological: Negative for dizziness and headaches.    Problem list and medications reviewed and updated by myself where necessary, and exist elsewhere in the  encounter.   OBJECTIVE:  BP 120/78 mmHg  Pulse 86  Temp(Src) 98.8 F (37.1 C) (Oral)  Resp 14  Ht 5' 1"  (1.549 m)  Wt 271 lb (122.925 kg)  BMI 51.23 kg/m2  SpO2 98%  LMP 03/09/2015  Physical Exam  Constitutional: She is oriented to person, place, and time. She appears well-developed. No distress.  Cardiovascular: Normal rate and regular rhythm.   Pulmonary/Chest: Effort normal and breath sounds normal.  Abdominal: Soft. Bowel sounds are normal. She exhibits no distension and no mass. There is tenderness (generalized). There is no rigidity, no rebound, no guarding, no CVA tenderness, no tenderness at McBurney's point and negative Murphy's sign.  Neurological: She is alert and oriented to person, place, and time.  Skin: Skin is warm and dry. She is not diaphoretic.  Vitals reviewed.   Recent Results (from the past 2160 hour(s))  Lipase, blood     Status: None   Collection Time: 03/27/15  6:37 PM  Result Value Ref Range   Lipase 43 11 - 51 U/L  Comprehensive metabolic panel     Status: Abnormal   Collection Time: 03/27/15  6:37 PM  Result Value Ref Range   Sodium 138 135 - 145 mmol/L   Potassium 4.1 3.5 - 5.1 mmol/L   Chloride 106 101 - 111 mmol/L   CO2 20 (L) 22 - 32 mmol/L   Glucose, Bld 116 (H) 65 - 99 mg/dL   BUN 13 6 - 20 mg/dL   Creatinine, Ser 0.77 0.44 - 1.00 mg/dL   Calcium 9.4 8.9 - 10.3 mg/dL   Total Protein 8.3 (H) 6.5 - 8.1 g/dL   Albumin 4.1 3.5 - 5.0 g/dL  AST 23 15 - 41 U/L   ALT 20 14 - 54 U/L   Alkaline Phosphatase 67 38 - 126 U/L   Total Bilirubin 0.2 (L) 0.3 - 1.2 mg/dL   GFR calc non Af Amer >60 >60 mL/min   GFR calc Af Amer >60 >60 mL/min    Comment: (NOTE) The eGFR has been calculated using the CKD EPI equation. This calculation has not been validated in all clinical situations. eGFR's persistently <60 mL/min signify possible Chronic Kidney Disease.    Anion gap 12 5 - 15  CBC     Status: Abnormal   Collection Time: 03/27/15  6:37 PM    Result Value Ref Range   WBC 10.2 4.0 - 10.5 K/uL   RBC 4.67 3.87 - 5.11 MIL/uL   Hemoglobin 8.9 (L) 12.0 - 15.0 g/dL   HCT 30.8 (L) 36.0 - 46.0 %   MCV 66.0 (L) 78.0 - 100.0 fL   MCH 19.1 (L) 26.0 - 34.0 pg   MCHC 28.9 (L) 30.0 - 36.0 g/dL   RDW 20.0 (H) 11.5 - 15.5 %   Platelets 364 150 - 400 K/uL  I-Stat beta hCG blood, ED ([facility], [facility], [facility] only)     Status: None   Collection Time: 03/27/15  6:41 PM  Result Value Ref Range   I-stat hCG, quantitative <5.0 <5 mIU/mL   Comment 3            Comment:   GEST. AGE      CONC.  (mIU/mL)   <=1 WEEK        5 - 50     2 WEEKS       50 - 500     3 WEEKS       100 - 10,000     4 WEEKS     1,000 - 30,000        FEMALE AND NON-PREGNANT FEMALE:     LESS THAN 5 mIU/mL   Urinalysis, Routine w reflex microscopic (not at [facility]     Status: Abnormal   Collection Time: 03/27/15  9:20 PM  Result Value Ref Range   Color, Urine YELLOW YELLOW   APPearance CLOUDY (A) CLEAR   Specific Gravity, Urine 1.034 (H) 1.005 - 1.030   pH 5.5 5.0 - 8.0   Glucose, UA NEGATIVE NEGATIVE mg/dL   Hgb urine dipstick NEGATIVE NEGATIVE   Bilirubin Urine NEGATIVE NEGATIVE   Ketones, ur NEGATIVE NEGATIVE mg/dL   Protein, ur NEGATIVE NEGATIVE mg/dL   Nitrite NEGATIVE NEGATIVE   Leukocytes, UA NEGATIVE NEGATIVE    Comment: MICROSCOPIC NOT DONE ON URINES WITH NEGATIVE PROTEIN, BLOOD, LEUKOCYTES, NITRITE, OR GLUCOSE <1000 mg/dL.  POCT urinalysis dipstick     Status: Abnormal   Collection Time: 04/01/15 10:56 AM  Result Value Ref Range   Color, UA yellow yellow   Clarity, UA clear clear   Glucose, UA negative negative   Bilirubin, UA small (A) negative   Ketones, POC UA negative negative   Spec Grav, UA 1.020    Blood, UA negative negative   pH, UA 5.5    Protein Ur, POC =30 (A) negative   Urobilinogen, UA 2.0    Nitrite, UA Negative Negative   Leukocytes, UA Negative Negative  POCT urine pregnancy     Status: None   Collection Time: 04/01/15 10:56 AM  Result Value Ref  Range   Preg Test, Ur Negative Negative  POCT Microscopic Urinalysis ([facility])     Status: Abnormal  Collection Time: 04/01/15 10:56 AM  Result Value Ref Range   WBC,UR,HPF,POC Few (A) None WBC/hpf   RBC,UR,HPF,POC None None RBC/hpf   Bacteria Moderate (A) None, Too numerous to count   Mucus Absent Absent   Epithelial Cells, UR Per Microscopy Many (A) None, Too numerous to count cells/hpf   Yeast buds   POCT CBC     Status: Abnormal   Collection Time: 04/01/15 11:04 AM  Result Value Ref Range   WBC 7.0 4.6 - 10.2 K/uL   Lymph, poc 1.4 0.6 - 3.4   POC LYMPH PERCENT 20.0 10 - 50 %L   MID (cbc) 0.5 0 - 0.9   POC MID % 7.4 0 - 12 %M   POC Granulocyte 5.1 2 - 6.9   Granulocyte percent 72.6 37 - 80 %G   RBC 4.18 4.04 - 5.48 M/uL   Hemoglobin 8.4 (A) 12.2 - 16.2 g/dL   HCT, POC 26.4 (A) 37.7 - 47.9 %   MCV 63.2 (A) 80 - 97 fL   MCH, POC 20.2 (A) 27 - 31.2 pg   MCHC 32.0 31.8 - 35.4 g/dL   RDW, POC 20.5 %   Platelet Count, POC 354 142 - 424 K/uL   MPV 8.1 0 - 99.8 fL    Results for orders placed or performed in visit on 04/01/15 (from the past 72 hour(s))  POCT urinalysis dipstick     Status: Abnormal   Collection Time: 04/01/15 10:56 AM  Result Value Ref Range   Color, UA yellow yellow   Clarity, UA clear clear   Glucose, UA negative negative   Bilirubin, UA small (A) negative   Ketones, POC UA negative negative   Spec Grav, UA 1.020    Blood, UA negative negative   pH, UA 5.5    Protein Ur, POC =30 (A) negative   Urobilinogen, UA 2.0    Nitrite, UA Negative Negative   Leukocytes, UA Negative Negative  POCT urine pregnancy     Status: None   Collection Time: 04/01/15 10:56 AM  Result Value Ref Range   Preg Test, Ur Negative Negative  POCT Microscopic Urinalysis ([facility])     Status: Abnormal   Collection Time: 04/01/15 10:56 AM  Result Value Ref Range   WBC,UR,HPF,POC Few (A) None WBC/hpf   RBC,UR,HPF,POC None None RBC/hpf   Bacteria Moderate (A) None, Too numerous to  count   Mucus Absent Absent   Epithelial Cells, UR Per Microscopy Many (A) None, Too numerous to count cells/hpf   Yeast buds   POCT CBC     Status: Abnormal   Collection Time: 04/01/15 11:04 AM  Result Value Ref Range   WBC 7.0 4.6 - 10.2 K/uL   Lymph, poc 1.4 0.6 - 3.4   POC LYMPH PERCENT 20.0 10 - 50 %L   MID (cbc) 0.5 0 - 0.9   POC MID % 7.4 0 - 12 %M   POC Granulocyte 5.1 2 - 6.9   Granulocyte percent 72.6 37 - 80 %G   RBC 4.18 4.04 - 5.48 M/uL   Hemoglobin 8.4 (A) 12.2 - 16.2 g/dL   HCT, POC 26.4 (A) 37.7 - 47.9 %   MCV 63.2 (A) 80 - 97 fL   MCH, POC 20.2 (A) 27 - 31.2 pg   MCHC 32.0 31.8 - 35.4 g/dL   RDW, POC 20.5 %   Platelet Count, POC 354 142 - 424 K/uL   MPV 8.1 0 - 99.8 fL  No results found.  ASSESSMENT AND PLAN  Kili was seen today for urinary tract infection.  Diagnoses and all orders for this visit:  Dysuria: She is trying to conceive and is undergoing fertility treatments at this time. Will culture.  Doubt UTI given problem 3 and she only complains of dysuria. She has no suprapubic tenderness.  -     POCT urinalysis dipstick -     POCT urine pregnancy -     POCT Microscopic Urinalysis ([facility]) -     POCT CBC -     Urine culture  Constipation, unspecified constipation type: CBC negative.  She has not used the bathroom in a week.  Advised clean out.   -     polyethylene glycol powder (GLYCOLAX/MIRALAX) powder; Take 17 grams two to three times daily as needed for constipation.  Yeast vaginitis: There are buds in her urine.  Will treat.  -     fluconazole (DIFLUCAN) 150 MG tablet; Take 1 tablet (150 mg total) by mouth once. Repeat if needed  History of gastroesophageal reflux (GERD): Stopping her omeprazole as this is unsafe in pregnancy and she is trying to conceive.  Will start her on Zantac bid and advised she may continue the carafate.   -     ranitidine (ZANTAC) 150 MG tablet; Take 1 tablet (150 mg total) by mouth 2 (two) times daily.    The  patient was advised to call or return to clinic if she does not see an improvement in symptoms or to seek the care of the closest emergency department if she worsens with the above plan.   Philis Fendt, MHS, PA-C [facility] 04/01/2015 11:25 AM

## 2015-04-02 LAB — URINE CULTURE

## 2015-04-03 LAB — PATHOLOGIST SMEAR REVIEW

## 2015-04-15 ENCOUNTER — Other Ambulatory Visit: Payer: Self-pay | Admitting: *Deleted

## 2015-04-15 DIAGNOSIS — R718 Other abnormality of red blood cells: Secondary | ICD-10-CM

## 2015-09-13 ENCOUNTER — Other Ambulatory Visit (HOSPITAL_COMMUNITY): Payer: Self-pay | Admitting: Obstetrics & Gynecology

## 2015-09-13 DIAGNOSIS — N970 Female infertility associated with anovulation: Secondary | ICD-10-CM

## 2015-09-14 ENCOUNTER — Ambulatory Visit (HOSPITAL_COMMUNITY)
Admission: RE | Admit: 2015-09-14 | Discharge: 2015-09-14 | Disposition: A | Discharge status: Home / Self Care | Payer: BLUE CROSS/BLUE SHIELD | Source: Ambulatory Visit | Attending: Obstetrics & Gynecology | Admitting: Obstetrics & Gynecology

## 2015-09-14 ENCOUNTER — Encounter (INDEPENDENT_AMBULATORY_CARE_PROVIDER_SITE_OTHER): Payer: Self-pay

## 2015-09-14 DIAGNOSIS — N970 Female infertility associated with anovulation: Secondary | ICD-10-CM | POA: Diagnosis not present

## 2015-09-14 MED ORDER — IOPAMIDOL (ISOVUE-300) INJECTION 61%
30.0000 mL | Freq: Once | INTRAVENOUS | Status: AC | PRN
  Administered 2015-09-14: 8 mL

## 2015-10-18 ENCOUNTER — Encounter (HOSPITAL_COMMUNITY): Payer: Self-pay

## 2016-01-15 HISTORY — PX: OVARIAN CYST REMOVAL: SHX89

## 2017-04-01 ENCOUNTER — Encounter (HOSPITAL_COMMUNITY): Payer: Self-pay

## 2017-08-06 ENCOUNTER — Other Ambulatory Visit: Payer: Self-pay | Admitting: Obstetrics & Gynecology

## 2017-08-10 ENCOUNTER — Encounter (HOSPITAL_COMMUNITY): Payer: Self-pay | Admitting: Certified Registered Nurse Anesthetist

## 2017-08-10 ENCOUNTER — Inpatient Hospital Stay (HOSPITAL_COMMUNITY): Payer: BLUE CROSS/BLUE SHIELD | Admitting: Anesthesiology

## 2017-08-10 ENCOUNTER — Encounter (HOSPITAL_COMMUNITY): Payer: Self-pay | Admitting: *Deleted

## 2017-08-10 ENCOUNTER — Other Ambulatory Visit: Payer: Self-pay

## 2017-08-10 ENCOUNTER — Inpatient Hospital Stay (HOSPITAL_COMMUNITY)
Admission: AD | Admit: 2017-08-10 | Discharge: 2017-08-13 | DRG: 788 | Disposition: A | Discharge status: Home / Self Care | Payer: BLUE CROSS/BLUE SHIELD | Attending: Obstetrics & Gynecology | Admitting: Obstetrics & Gynecology

## 2017-08-10 DIAGNOSIS — Z98891 History of uterine scar from previous surgery: Secondary | ICD-10-CM

## 2017-08-10 DIAGNOSIS — O34211 Maternal care for low transverse scar from previous cesarean delivery: Secondary | ICD-10-CM | POA: Diagnosis present

## 2017-08-10 DIAGNOSIS — O1205 Gestational edema, complicating the puerperium: Secondary | ICD-10-CM | POA: Diagnosis present

## 2017-08-10 DIAGNOSIS — O324XX0 Maternal care for high head at term, not applicable or unspecified: Secondary | ICD-10-CM | POA: Diagnosis present

## 2017-08-10 DIAGNOSIS — Z3A41 41 weeks gestation of pregnancy: Secondary | ICD-10-CM

## 2017-08-10 DIAGNOSIS — O9902 Anemia complicating childbirth: Secondary | ICD-10-CM | POA: Diagnosis present

## 2017-08-10 DIAGNOSIS — O99844 Bariatric surgery status complicating childbirth: Secondary | ICD-10-CM | POA: Diagnosis present

## 2017-08-10 DIAGNOSIS — D509 Iron deficiency anemia, unspecified: Secondary | ICD-10-CM | POA: Diagnosis present

## 2017-08-10 DIAGNOSIS — O99214 Obesity complicating childbirth: Secondary | ICD-10-CM | POA: Diagnosis present

## 2017-08-10 DIAGNOSIS — O480 Post-term pregnancy: Secondary | ICD-10-CM | POA: Diagnosis present

## 2017-08-10 LAB — PROTEIN / CREATININE RATIO, URINE
CREATININE, URINE: 235 mg/dL
PROTEIN CREATININE RATIO: 0.21 mg/mg{creat} — AB (ref 0.00–0.15)
TOTAL PROTEIN, URINE: 50 mg/dL

## 2017-08-10 LAB — COMPREHENSIVE METABOLIC PANEL
ALK PHOS: 90 U/L (ref 38–126)
ALT: 20 U/L (ref 0–44)
AST: 23 U/L (ref 15–41)
Albumin: 3.2 g/dL — ABNORMAL LOW (ref 3.5–5.0)
Anion gap: 10 (ref 5–15)
BILIRUBIN TOTAL: 0.5 mg/dL (ref 0.3–1.2)
BUN: 10 mg/dL (ref 6–20)
CALCIUM: 9.2 mg/dL (ref 8.9–10.3)
CO2: 20 mmol/L — ABNORMAL LOW (ref 22–32)
Chloride: 106 mmol/L (ref 98–111)
Creatinine, Ser: 0.7 mg/dL (ref 0.44–1.00)
Glucose, Bld: 75 mg/dL (ref 70–99)
Potassium: 4.5 mmol/L (ref 3.5–5.1)
Sodium: 136 mmol/L (ref 135–145)
TOTAL PROTEIN: 6.4 g/dL — AB (ref 6.5–8.1)

## 2017-08-10 LAB — CBC
HCT: 32.8 % — ABNORMAL LOW (ref 36.0–46.0)
HCT: 33 % — ABNORMAL LOW (ref 36.0–46.0)
Hemoglobin: 10.6 g/dL — ABNORMAL LOW (ref 12.0–15.0)
Hemoglobin: 10.7 g/dL — ABNORMAL LOW (ref 12.0–15.0)
MCH: 26.7 pg (ref 26.0–34.0)
MCH: 26.9 pg (ref 26.0–34.0)
MCHC: 32.3 g/dL (ref 30.0–36.0)
MCHC: 32.4 g/dL (ref 30.0–36.0)
MCV: 82.6 fL (ref 78.0–100.0)
MCV: 82.9 fL (ref 78.0–100.0)
PLATELETS: 194 10*3/uL (ref 150–400)
Platelets: 217 10*3/uL (ref 150–400)
RBC: 3.97 MIL/uL (ref 3.87–5.11)
RBC: 3.98 MIL/uL (ref 3.87–5.11)
RDW: 17.9 % — ABNORMAL HIGH (ref 11.5–15.5)
RDW: 17.8 % — AB (ref 11.5–15.5)
WBC: 8.5 10*3/uL (ref 4.0–10.5)
WBC: 10.5 10*3/uL (ref 4.0–10.5)

## 2017-08-10 LAB — TYPE AND SCREEN
ABO/RH(D): O POS
Antibody Screen: NEGATIVE

## 2017-08-10 MED ORDER — FENTANYL 2.5 MCG/ML BUPIVACAINE 1/10 % EPIDURAL INFUSION (WH - ANES)
14.0000 mL/h | INTRAMUSCULAR | Status: DC | PRN
  Administered 2017-08-10: 14 mL/h via EPIDURAL
  Filled 2017-08-10: qty 100

## 2017-08-10 MED ORDER — LACTATED RINGERS IV SOLN: 500.0000 mL | Freq: Once | INTRAVENOUS | Status: DC

## 2017-08-10 MED ORDER — OXYTOCIN 40 UNITS IN LACTATED RINGERS INFUSION - SIMPLE MED: 1.0000 m[IU]/min | INTRAVENOUS | Status: DC

## 2017-08-10 MED ORDER — BUTORPHANOL TARTRATE 1 MG/ML IJ SOLN
2.0000 mg | INTRAMUSCULAR | Status: AC | PRN
  Administered 2017-08-10 (×2): 2 mg via INTRAVENOUS
  Filled 2017-08-10: qty 2

## 2017-08-10 MED ORDER — LIDOCAINE HCL (PF) 1 % IJ SOLN: 30.0000 mL | INTRAMUSCULAR | Status: DC | PRN

## 2017-08-10 MED ORDER — DIPHENHYDRAMINE HCL 50 MG/ML IJ SOLN: 12.5000 mg | INTRAMUSCULAR | Status: DC | PRN

## 2017-08-10 MED ORDER — OXYTOCIN BOLUS FROM INFUSION: 500.0000 mL | Freq: Once | INTRAVENOUS | Status: DC

## 2017-08-10 MED ORDER — LACTATED RINGERS IV SOLN
500.0000 mL | INTRAVENOUS | Status: DC | PRN
  Administered 2017-08-10: 1000 mL via INTRAVENOUS

## 2017-08-10 MED ORDER — TERBUTALINE SULFATE 1 MG/ML IJ SOLN: 0.2500 mg | Freq: Once | INTRAMUSCULAR | Status: DC | PRN

## 2017-08-10 MED ORDER — OXYTOCIN 40 UNITS IN LACTATED RINGERS INFUSION - SIMPLE MED
1.0000 m[IU]/min | INTRAVENOUS | Status: DC
  Administered 2017-08-10: 2 m[IU]/min via INTRAVENOUS
  Filled 2017-08-10: qty 1000

## 2017-08-10 MED ORDER — LIDOCAINE HCL (PF) 1 % IJ SOLN
INTRAMUSCULAR | Status: DC | PRN
  Administered 2017-08-10 (×2): 6 mL via EPIDURAL

## 2017-08-10 MED ORDER — LACTATED RINGERS IV SOLN
INTRAVENOUS | Status: DC
  Administered 2017-08-10 (×2): via INTRAVENOUS

## 2017-08-10 MED ORDER — ONDANSETRON HCL 4 MG/2ML IJ SOLN: 4.0000 mg | Freq: Four times a day (QID) | INTRAMUSCULAR | Status: DC | PRN

## 2017-08-10 MED ORDER — ACETAMINOPHEN 325 MG PO TABS: 650.0000 mg | ORAL_TABLET | ORAL | Status: DC | PRN

## 2017-08-10 MED ORDER — PHENYLEPHRINE 40 MCG/ML (10ML) SYRINGE FOR IV PUSH (FOR BLOOD PRESSURE SUPPORT)
80.0000 ug | PREFILLED_SYRINGE | INTRAVENOUS | Status: DC | PRN
  Filled 2017-08-10: qty 10

## 2017-08-10 MED ORDER — PHENYLEPHRINE 40 MCG/ML (10ML) SYRINGE FOR IV PUSH (FOR BLOOD PRESSURE SUPPORT): 80.0000 ug | PREFILLED_SYRINGE | INTRAVENOUS | Status: DC | PRN

## 2017-08-10 MED ORDER — OXYTOCIN 40 UNITS IN LACTATED RINGERS INFUSION - SIMPLE MED: 2.5000 [IU]/h | INTRAVENOUS | Status: DC

## 2017-08-10 MED ORDER — EPHEDRINE 5 MG/ML INJ: 10.0000 mg | INTRAVENOUS | Status: DC | PRN

## 2017-08-10 MED ORDER — BUTORPHANOL TARTRATE 1 MG/ML IJ SOLN
INTRAMUSCULAR | Status: AC
  Filled 2017-08-10: qty 2

## 2017-08-10 MED ORDER — SOD CITRATE-CITRIC ACID 500-334 MG/5ML PO SOLN
30.0000 mL | ORAL | Status: DC | PRN
  Administered 2017-08-10 – 2017-08-11 (×2): 30 mL via ORAL
  Filled 2017-08-10 (×2): qty 15

## 2017-08-10 NOTE — Progress Notes (Signed)
Comfortable, not feeling contractions No c/o  Patient Vitals for the past 24 hrs:  BP Temp Temp src Pulse Resp Height Weight  08/10/17 1235 (!) 151/85 - - 64 16 - -  08/10/17 1120 140/88 98.7 F (37.1 C) Oral 64 16 5\' 1"  (1.549 m) 282 lb 8 oz (128.1 kg)   A&ox3 Normal respirations Abd: soft, nt, gravid Cx: 1/40/-3, posterior Cervical balloon placed and confirmed good placement; pt tolerated well LE: tr edema, nt bilat  Fht: 130s, nml variability, +accels, no decels Toco: irregular contraction  A/p: iup at 41.2 wga 1. H/o ltcs, desires tolac; reviewed r/b/a including risk of uterine rupture which can cause maternal hemorrhage and possible fetal death, consent signed by pt and wants TOLAC; s/p balloon placement and begin pitocin  2. Fetal status ressuring 3. Obesity 4. ivf pregnancy 5. Post-dates; last growth u/s at 40.4 and 7'7" (52%) 6. Chronic anemia 7. Elevated bp - changing bp cuffs to ensure appropriate size and get pih labs

## 2017-08-10 NOTE — Progress Notes (Signed)
Pitocin off per Dr Juliene PinaMody

## 2017-08-10 NOTE — Progress Notes (Signed)
Dr Juliene PinaMody called to unit. Notified pt c/o constant sharp vaginal pain that gets worse with ctxs, pt redosed by CRNA, FHR with baseline change from 120-130 to 150's with early and vbl decels, pitocin remains at 1cm, most recent SVE 6/70/-2, pt sitting in high fowlers at this time. Orders received to discontinue pitocin and notify her with next SVE.

## 2017-08-10 NOTE — Progress Notes (Signed)
Pt up in bathroom at 1800

## 2017-08-10 NOTE — Anesthesia Procedure Notes (Signed)
Epidural Patient location during procedure: OB Start time: 08/10/2017 7:15 PM End time: 08/10/2017 7:17 PM  Staffing Anesthesiologist: Leilani AbleHatchett, Norm Wray, MD Performed: anesthesiologist   Preanesthetic Checklist Completed: patient identified, site marked, surgical consent, pre-op evaluation, timeout performed, IV checked, risks and benefits discussed and monitors and equipment checked  Epidural Patient position: sitting Prep: site prepped and draped and DuraPrep Patient monitoring: continuous pulse ox and blood pressure Approach: midline Location: L3-L4 Injection technique: LOR air  Needle:  Needle type: Tuohy  Needle gauge: 17 G Needle length: 9 cm and 9 Needle insertion depth: 7 cm Catheter type: closed end flexible Catheter size: 19 Gauge Catheter at skin depth: 13 cm Test dose: negative and Other  Assessment Sensory level: T9 Events: blood not aspirated, injection not painful, no injection resistance, negative IV test and no paresthesia  Additional Notes Reason for block:procedure for pain

## 2017-08-10 NOTE — Progress Notes (Signed)
Victoria Knight is a 32 y.o. G2P1001 at 6815w2d by IVF admitted for labor IOL for dates, desires TOLAC.  Prior LTCS x1 for arrest of dilatation and descent at 4 cm  Subjective: Painful UCs. S/p Stadol 2 mg x 2 doses (3hr apart). Denies HA/ visula changes/ other s/s of PEC. Normal BPs in pregnancy, no h/o HTN  Objective: BP (!) 152/92   Pulse 61   Temp 98.7 F (37.1 C) (Oral)   Resp 20   Ht 5\' 1"  (1.549 m)   Wt 282 lb 8 oz (128.1 kg)   BMI 53.38 kg/m   FHT:  FHR: 120 bpm, variability: moderate,  accelerations:  Present,  decelerations:  Absent UC:   regular, every 2-3 minutes SVE:   Dilation: 5 Effacement (%): 50 Station: Ballotable Exam by:: Dr Ty HiltsMody Foley was in vagina, removed, High station -4, controlled AROM, meconium stained minimal fluid, head well applied to cervix Borderline pelvis  Assessment / Plan: Induction of labor due to postterm,  progressing well on pitocin High station and borderline pelvis, will monitor progress. Continue low dose pitocin only  Preeclampsia: No clinical signs or symptoms, elevated BPs since few hours after IOL, Platelets, Creat, LFT nl, urine p/c ratio pending Fetal Wellbeing:  Category I Pain Control:  IV pain meds but proceed with epidural now, then high Fowler position to aid descent I/D:  n/a Anticipated MOD:  Guarded, working on [facility]  Victoria Knight 08/10/2017, 6:36 PM

## 2017-08-10 NOTE — Anesthesia Preprocedure Evaluation (Signed)
Anesthesia Evaluation  Patient identified by MRN, date of birth, ID band Patient awake    Reviewed: Allergy & Precautions, H&P , NPO status , Patient's Chart, lab work & pertinent test results  History of Anesthesia Complications Negative for: history of anesthetic complications  Airway Mallampati: III  TM Distance: >3 FB Neck ROM: full    Dental no notable dental hx. (+) Teeth Intact   Pulmonary neg pulmonary ROS,    Pulmonary exam normal breath sounds clear to auscultation       Cardiovascular Normal cardiovascular exam Rhythm:regular Rate:Normal     Neuro/Psych PSYCHIATRIC DISORDERS (bipolar 1, anxiety) negative neurological ROS     GI/Hepatic negative GI ROS, Neg liver ROS,   Endo/Other  Morbid obesity  Renal/GU negative Renal ROS  negative genitourinary   Musculoskeletal   Abdominal (+) + obese,   Peds  Hematology  (+) anemia ,   Anesthesia Other Findings   Reproductive/Obstetrics (+) Pregnancy                             Anesthesia Physical  Anesthesia Plan  ASA: III  Anesthesia Plan: Epidural   Post-op Pain Management:    Induction:   PONV Risk Score and Plan:   Airway Management Planned:   Additional Equipment:   Intra-op Plan:   Post-operative Plan:   Informed Consent: I have reviewed the patients History and Physical, chart, labs and discussed the procedure including the risks, benefits and alternatives for the proposed anesthesia with the patient or authorized representative who has indicated his/her understanding and acceptance.     Plan Discussed with:   Anesthesia Plan Comments:         Anesthesia Quick Evaluation

## 2017-08-10 NOTE — H&P (Signed)
Victoria MaiersChristina I Nauman is a 32 y.o. female presenting for labor IOL at 41.2 wks, Desires TOLAC.  G1- SROM admit, 39.4 wks, arrest of dilatation and descent at 4 cm, Boy, 6'4"  This is IVF pregnancy (female factor). PCOS, Obesity, Anemia, Lap Band (2010), [facility] LTCS.  PNCare from 8 wks. IV Iron in preg x 2. CL normal at 16, 19 wks, Growth AGA, last sono 40.4 wks 7 lbs 7 oz, 52%, nl AC, AFI 8 cm, Vx  .  OB History    Gravida  1   Para  1   Term  1   Preterm  0   AB  0   Living  1     SAB  0   TAB  0   Ectopic  0   Multiple  0   Live Births  1          Past Medical History:  Diagnosis Date  . Anemia   . Contact lens/glasses fitting   . Iron deficiency anemia 10/27/2011  . Normal labor 10/26/2011  . Postpartum care following cesarean delivery 10/28/2011  . PROM (premature rupture of membranes) 10/26/2011  . S/P cesarean section (arrest of dialation, 10/13) 10/28/2011  . SOB (shortness of breath)    Past Surgical History:  Procedure Laterality Date  . CESAREAN SECTION  10/27/2011   Procedure: CESAREAN SECTION;  Surgeon: Genia DelMarie-Lyne Lavoie, MD;  Location: [facility];  Service: Obstetrics;  Laterality: N/A;  Primary cesarean section with delivery of baby boy at [id number]. Apgars  9/9.  Marland Kitchen. LAPAROSCOPIC GASTRIC BANDING  09/13/08   Family History: family history includes Arthritis in her father; Diabetes in her father; Gout in her father; Heart disease in her father; Hypertension in her father. Social History:  reports that she has never smoked. She has never used smokeless tobacco. She reports that she does not drink alcohol or use drugs.     Maternal Diabetes: No Genetic Screening: Normal Ultrascreen and AFP1  Maternal Ultrasounds/Referrals: Normal anatomy and growth.  Fetal Ultrasounds or other Referrals:  None Maternal Substance Abuse:  No Significant Maternal Medications:  Meds include: Zantac and hormones in 1st trim with IVF Significant Maternal Lab Results:  Lab values  include: Group B Strep negative Other Comments:  IVF preg   ROS History   There were no vitals taken for this visit. Exam Physical Exam  Physical exam:  A&O x 3, no acute distress. Pleasant HEENT neg, no thyromegaly Lungs CTA bilat CV RRR, S1S2 normal Abdo soft, non tender, non acute Extr no edema/ tenderness Pelvic  FHT  Toco   Prenatal labs: ABO, Rh:  O(+) Antibody:  Neg Rubella:  Imm RPR:   NR HBsAg:   NEg HIV:   Neg GBS:   Neg Glucola normal. Ultrascreen neg, AFP1 normal  Assessment/Plan: 32 yo G2P1001, 41.2 wks, IVF, Obesity, LabBand surgery.  here for labor IOL for TOLAC. Plan low dose pitocin and AROM when appropriate. Risk of scar dehiscence with LTCS with pitocin is increased and patient [facility] and understands risk from scar dehiscence causing maternal fetal distress.  Will prepare for urgent c/s. At increased risk since prior C/s was for arrest of dilatation and descent with a smaller baby.  Plan epidural MOD- guarded    Robley FriesVaishali R Sharae Zappulla 08/10/2017, 10:54 AM

## 2017-08-10 NOTE — Anesthesia Pain Management Evaluation Note (Signed)
  CRNA Pain Management Visit Note  Patient: Victoria Knight, 32 y.o., female  "Hello I am a member of the anesthesia team at [facility]. We have an anesthesia team available at all times to provide care throughout the hospital, including epidural management and anesthesia for C-section. I don't know your plan for the delivery whether it a natural birth, water birth, IV sedation, nitrous supplementation, doula or epidural, but we want to meet your pain goals."   1.Was your pain managed to your expectations on prior hospitalizations?   Yes   2.What is your expectation for pain management during this hospitalization?     Epidural  3.How can we help you reach that goal? Epidural at pain goal. The patient had a C/S with her first baby and during this pregnancy has had significant gastric reflux She reports that occasionally, she awakens at night vomiting .  Record the patient's initial score and the patient's pain goal.   Pain: 0  Pain Goal: 6 [facility] wants you to be able to say your pain was always managed very well.  Junelle Hashemi 08/10/2017

## 2017-08-11 ENCOUNTER — Encounter (HOSPITAL_COMMUNITY): Payer: Self-pay

## 2017-08-11 ENCOUNTER — Inpatient Hospital Stay (HOSPITAL_COMMUNITY): Payer: BLUE CROSS/BLUE SHIELD | Admitting: Certified Registered Nurse Anesthetist

## 2017-08-11 ENCOUNTER — Encounter (HOSPITAL_COMMUNITY)
Admission: AD | Disposition: A | Discharge status: Home / Self Care | Payer: Self-pay | Source: Home / Self Care | Attending: Obstetrics & Gynecology

## 2017-08-11 DIAGNOSIS — Z98891 History of uterine scar from previous surgery: Secondary | ICD-10-CM

## 2017-08-11 LAB — CBC
HCT: 28.8 % — ABNORMAL LOW (ref 36.0–46.0)
Hemoglobin: 9.4 g/dL — ABNORMAL LOW (ref 12.0–15.0)
MCH: 26.9 pg (ref 26.0–34.0)
MCHC: 32.6 g/dL (ref 30.0–36.0)
MCV: 82.3 fL (ref 78.0–100.0)
PLATELETS: 180 10*3/uL (ref 150–400)
RBC: 3.5 MIL/uL — AB (ref 3.87–5.11)
RDW: 17.9 % — ABNORMAL HIGH (ref 11.5–15.5)
WBC: 15.7 10*3/uL — ABNORMAL HIGH (ref 4.0–10.5)

## 2017-08-11 LAB — RPR: RPR Ser Ql: NONREACTIVE

## 2017-08-11 SURGERY — Surgical Case
Anesthesia: Epidural

## 2017-08-11 MED ORDER — ACETAMINOPHEN 500 MG PO TABS
1000.0000 mg | ORAL_TABLET | Freq: Four times a day (QID) | ORAL | Status: DC
  Administered 2017-08-11 (×3): 1000 mg via ORAL
  Filled 2017-08-11 (×3): qty 2

## 2017-08-11 MED ORDER — DIPHENHYDRAMINE HCL 25 MG PO CAPS: 25.0000 mg | ORAL_CAPSULE | Freq: Four times a day (QID) | ORAL | Status: DC | PRN

## 2017-08-11 MED ORDER — SODIUM CHLORIDE 0.9% FLUSH: 3.0000 mL | INTRAVENOUS | Status: DC | PRN

## 2017-08-11 MED ORDER — OXYCODONE-ACETAMINOPHEN 5-325 MG PO TABS
2.0000 | ORAL_TABLET | ORAL | Status: DC | PRN
  Administered 2017-08-13: 2 via ORAL
  Filled 2017-08-11: qty 2

## 2017-08-11 MED ORDER — MEPERIDINE HCL 25 MG/ML IJ SOLN: 6.2500 mg | INTRAMUSCULAR | Status: DC | PRN

## 2017-08-11 MED ORDER — MORPHINE SULFATE (PF) 0.5 MG/ML IJ SOLN
INTRAMUSCULAR | Status: AC
  Filled 2017-08-11: qty 10

## 2017-08-11 MED ORDER — ONDANSETRON HCL 4 MG/2ML IJ SOLN
INTRAMUSCULAR | Status: DC | PRN
  Administered 2017-08-11: 4 mg via INTRAVENOUS

## 2017-08-11 MED ORDER — NALOXONE HCL 4 MG/10ML IJ SOLN
1.0000 ug/kg/h | INTRAVENOUS | Status: DC | PRN
  Filled 2017-08-11: qty 5

## 2017-08-11 MED ORDER — NALBUPHINE HCL 10 MG/ML IJ SOLN
5.0000 mg | Freq: Once | INTRAMUSCULAR | Status: AC | PRN
  Administered 2017-08-11: 5 mg via SUBCUTANEOUS

## 2017-08-11 MED ORDER — OXYTOCIN 40 UNITS IN LACTATED RINGERS INFUSION - SIMPLE MED: 2.5000 [IU]/h | INTRAVENOUS | Status: AC

## 2017-08-11 MED ORDER — WITCH HAZEL-GLYCERIN EX PADS: 1.0000 "application " | MEDICATED_PAD | CUTANEOUS | Status: DC | PRN

## 2017-08-11 MED ORDER — DIPHENHYDRAMINE HCL 50 MG/ML IJ SOLN: 12.5000 mg | INTRAMUSCULAR | Status: DC | PRN

## 2017-08-11 MED ORDER — DIBUCAINE 1 % RE OINT: 1.0000 "application " | TOPICAL_OINTMENT | RECTAL | Status: DC | PRN

## 2017-08-11 MED ORDER — NALBUPHINE HCL 10 MG/ML IJ SOLN: 5.0000 mg | Freq: Once | INTRAMUSCULAR | Status: AC | PRN

## 2017-08-11 MED ORDER — SCOPOLAMINE 1 MG/3DAYS TD PT72
MEDICATED_PATCH | TRANSDERMAL | Status: AC
  Filled 2017-08-11: qty 1

## 2017-08-11 MED ORDER — SENNOSIDES-DOCUSATE SODIUM 8.6-50 MG PO TABS
2.0000 | ORAL_TABLET | ORAL | Status: DC
  Administered 2017-08-11 – 2017-08-12 (×2): 2 via ORAL
  Filled 2017-08-11 (×2): qty 2

## 2017-08-11 MED ORDER — ACETAMINOPHEN 325 MG PO TABS: 650.0000 mg | ORAL_TABLET | ORAL | Status: DC | PRN

## 2017-08-11 MED ORDER — ONDANSETRON HCL 4 MG/2ML IJ SOLN: 4.0000 mg | Freq: Three times a day (TID) | INTRAMUSCULAR | Status: DC | PRN

## 2017-08-11 MED ORDER — MEPERIDINE HCL 25 MG/ML IJ SOLN
INTRAMUSCULAR | Status: DC | PRN
  Administered 2017-08-11 (×2): 12.5 mg via INTRAVENOUS

## 2017-08-11 MED ORDER — TETANUS-DIPHTH-ACELL PERTUSSIS 5-2.5-18.5 LF-MCG/0.5 IM SUSP: 0.5000 mL | Freq: Once | INTRAMUSCULAR | Status: DC

## 2017-08-11 MED ORDER — OXYTOCIN 10 UNIT/ML IJ SOLN
INTRAMUSCULAR | Status: DC | PRN
  Administered 2017-08-11: 40 [IU] via INTRAVENOUS

## 2017-08-11 MED ORDER — BUPIVACAINE HCL (PF) 0.5 % IJ SOLN
INTRAMUSCULAR | Status: AC
  Filled 2017-08-11: qty 30

## 2017-08-11 MED ORDER — OXYTOCIN 10 UNIT/ML IJ SOLN
INTRAMUSCULAR | Status: AC
  Filled 2017-08-11: qty 4

## 2017-08-11 MED ORDER — HYDROMORPHONE HCL 1 MG/ML IJ SOLN: 0.2500 mg | INTRAMUSCULAR | Status: DC | PRN

## 2017-08-11 MED ORDER — PRENATAL MULTIVITAMIN CH
1.0000 | ORAL_TABLET | Freq: Every day | ORAL | Status: DC
  Administered 2017-08-11 – 2017-08-13 (×3): 1 via ORAL
  Filled 2017-08-11 (×3): qty 1

## 2017-08-11 MED ORDER — KETOROLAC TROMETHAMINE 30 MG/ML IJ SOLN: 30.0000 mg | Freq: Once | INTRAMUSCULAR | Status: DC | PRN

## 2017-08-11 MED ORDER — SCOPOLAMINE 1 MG/3DAYS TD PT72
MEDICATED_PATCH | TRANSDERMAL | Status: DC | PRN
  Administered 2017-08-11: 1 via TRANSDERMAL

## 2017-08-11 MED ORDER — KETOROLAC TROMETHAMINE 30 MG/ML IJ SOLN
30.0000 mg | Freq: Four times a day (QID) | INTRAMUSCULAR | Status: DC | PRN
  Administered 2017-08-11: 30 mg via INTRAVENOUS

## 2017-08-11 MED ORDER — DEXAMETHASONE SODIUM PHOSPHATE 4 MG/ML IJ SOLN
INTRAMUSCULAR | Status: AC
  Filled 2017-08-11: qty 1

## 2017-08-11 MED ORDER — SIMETHICONE 80 MG PO CHEW
80.0000 mg | CHEWABLE_TABLET | Freq: Three times a day (TID) | ORAL | Status: DC
  Administered 2017-08-11 – 2017-08-13 (×6): 80 mg via ORAL
  Filled 2017-08-11 (×6): qty 1

## 2017-08-11 MED ORDER — LACTATED RINGERS IV SOLN
INTRAVENOUS | Status: DC
  Administered 2017-08-11 (×2): via INTRAVENOUS

## 2017-08-11 MED ORDER — MORPHINE SULFATE (PF) 0.5 MG/ML IJ SOLN
INTRAMUSCULAR | Status: DC | PRN
  Administered 2017-08-11: 3.5 mg via EPIDURAL

## 2017-08-11 MED ORDER — FERROUS SULFATE 325 (65 FE) MG PO TABS
325.0000 mg | ORAL_TABLET | Freq: Every day | ORAL | Status: DC
  Administered 2017-08-11 – 2017-08-12 (×2): 325 mg via ORAL
  Filled 2017-08-11 (×2): qty 1

## 2017-08-11 MED ORDER — SODIUM CHLORIDE 0.9 % IR SOLN
Status: DC | PRN
  Administered 2017-08-11: 1000 mL

## 2017-08-11 MED ORDER — KETOROLAC TROMETHAMINE 30 MG/ML IJ SOLN: 30.0000 mg | Freq: Four times a day (QID) | INTRAMUSCULAR | Status: DC | PRN

## 2017-08-11 MED ORDER — MEPERIDINE HCL 25 MG/ML IJ SOLN
INTRAMUSCULAR | Status: AC
  Filled 2017-08-11: qty 1

## 2017-08-11 MED ORDER — KETOROLAC TROMETHAMINE 30 MG/ML IJ SOLN
INTRAMUSCULAR | Status: AC
  Filled 2017-08-11: qty 1

## 2017-08-11 MED ORDER — NALBUPHINE HCL 10 MG/ML IJ SOLN: 5.0000 mg | INTRAMUSCULAR | Status: DC | PRN

## 2017-08-11 MED ORDER — LACTATED RINGERS IV SOLN
INTRAVENOUS | Status: DC | PRN
  Administered 2017-08-11 (×2): via INTRAVENOUS

## 2017-08-11 MED ORDER — IBUPROFEN 600 MG PO TABS
600.0000 mg | ORAL_TABLET | Freq: Four times a day (QID) | ORAL | Status: DC
  Administered 2017-08-11 – 2017-08-12 (×2): 600 mg via ORAL
  Filled 2017-08-11 (×3): qty 1

## 2017-08-11 MED ORDER — SODIUM BICARBONATE 8.4 % IV SOLN
INTRAVENOUS | Status: DC | PRN
  Administered 2017-08-11: 5 mL via EPIDURAL
  Administered 2017-08-11: 3 mL via EPIDURAL

## 2017-08-11 MED ORDER — SIMETHICONE 80 MG PO CHEW: 80.0000 mg | CHEWABLE_TABLET | ORAL | Status: DC | PRN

## 2017-08-11 MED ORDER — NALOXONE HCL 0.4 MG/ML IJ SOLN: 0.4000 mg | INTRAMUSCULAR | Status: DC | PRN

## 2017-08-11 MED ORDER — BUPIVACAINE HCL (PF) 0.5 % IJ SOLN
INTRAMUSCULAR | Status: DC | PRN
  Administered 2017-08-11: 30 mL

## 2017-08-11 MED ORDER — PHENYLEPHRINE HCL 10 MG/ML IJ SOLN
INTRAMUSCULAR | Status: DC | PRN
  Administered 2017-08-11: 80 ug via INTRAVENOUS

## 2017-08-11 MED ORDER — PROMETHAZINE HCL 25 MG/ML IJ SOLN: 6.2500 mg | INTRAMUSCULAR | Status: DC | PRN

## 2017-08-11 MED ORDER — COCONUT OIL OIL
1.0000 "application " | TOPICAL_OIL | Status: DC | PRN
  Administered 2017-08-11: 1 via TOPICAL
  Filled 2017-08-11: qty 120

## 2017-08-11 MED ORDER — ONDANSETRON HCL 4 MG/2ML IJ SOLN
INTRAMUSCULAR | Status: AC
  Filled 2017-08-11: qty 2

## 2017-08-11 MED ORDER — SIMETHICONE 80 MG PO CHEW
80.0000 mg | CHEWABLE_TABLET | ORAL | Status: DC
  Administered 2017-08-11 – 2017-08-12 (×2): 80 mg via ORAL
  Filled 2017-08-11 (×2): qty 1

## 2017-08-11 MED ORDER — MENTHOL 3 MG MT LOZG: 1.0000 | LOZENGE | OROMUCOSAL | Status: DC | PRN

## 2017-08-11 MED ORDER — DEXTROSE 5 % IV SOLN
3.0000 g | INTRAVENOUS | Status: AC
  Administered 2017-08-11: 3 g via INTRAVENOUS
  Filled 2017-08-11: qty 3000

## 2017-08-11 MED ORDER — OXYCODONE-ACETAMINOPHEN 5-325 MG PO TABS
1.0000 | ORAL_TABLET | ORAL | Status: DC | PRN
  Administered 2017-08-12 (×2): 1 via ORAL
  Filled 2017-08-11 (×2): qty 1

## 2017-08-11 MED ORDER — SCOPOLAMINE 1 MG/3DAYS TD PT72: 1.0000 | MEDICATED_PATCH | Freq: Once | TRANSDERMAL | Status: DC

## 2017-08-11 MED ORDER — NALBUPHINE HCL 10 MG/ML IJ SOLN
5.0000 mg | INTRAMUSCULAR | Status: DC | PRN
  Filled 2017-08-11: qty 1

## 2017-08-11 MED ORDER — DEXAMETHASONE SODIUM PHOSPHATE 4 MG/ML IJ SOLN
INTRAMUSCULAR | Status: DC | PRN
  Administered 2017-08-11: 4 mg via INTRAVENOUS

## 2017-08-11 MED ORDER — ZOLPIDEM TARTRATE 5 MG PO TABS: 5.0000 mg | ORAL_TABLET | Freq: Every evening | ORAL | Status: DC | PRN

## 2017-08-11 MED ORDER — STERILE WATER FOR IRRIGATION IR SOLN
Status: DC | PRN
  Administered 2017-08-11: 1000 mL

## 2017-08-11 MED ORDER — LIDOCAINE-EPINEPHRINE (PF) 2 %-1:200000 IJ SOLN
INTRAMUSCULAR | Status: AC
  Filled 2017-08-11: qty 20

## 2017-08-11 MED ORDER — DIPHENHYDRAMINE HCL 25 MG PO CAPS
25.0000 mg | ORAL_CAPSULE | ORAL | Status: DC | PRN
  Filled 2017-08-11: qty 1

## 2017-08-11 MED ORDER — PANTOPRAZOLE SODIUM 20 MG PO TBEC
20.0000 mg | DELAYED_RELEASE_TABLET | Freq: Two times a day (BID) | ORAL | Status: DC
  Administered 2017-08-11 – 2017-08-13 (×5): 20 mg via ORAL
  Filled 2017-08-11 (×5): qty 1

## 2017-08-11 SURGICAL SUPPLY — 45 items
APL SKNCLS STERI-STRIP NONHPOA (GAUZE/BANDAGES/DRESSINGS)
BARRIER ADHS 3X4 INTERCEED (GAUZE/BANDAGES/DRESSINGS) ×1 IMPLANT
BENZOIN TINCTURE PRP APPL 2/3 (GAUZE/BANDAGES/DRESSINGS) IMPLANT
BRR ADH 4X3 ABS CNTRL BYND (GAUZE/BANDAGES/DRESSINGS) ×1
CHLORAPREP W/TINT 26ML (MISCELLANEOUS) ×2 IMPLANT
CLAMP CORD UMBIL (MISCELLANEOUS) IMPLANT
CLOSURE STERI STRIP 1/2 X4 (GAUZE/BANDAGES/DRESSINGS) ×1 IMPLANT
CLOTH BEACON ORANGE TIMEOUT ST (SAFETY) ×2 IMPLANT
DRAPE EENT ADH APERT 31X51 STR (DRAPES) ×1 IMPLANT
DRSG OPSITE POSTOP 4X10 (GAUZE/BANDAGES/DRESSINGS) ×2 IMPLANT
ELECT REM PT RETURN 9FT ADLT (ELECTROSURGICAL) ×2
ELECTRODE REM PT RTRN 9FT ADLT (ELECTROSURGICAL) ×1 IMPLANT
EXTRACTOR VACUUM KIWI (MISCELLANEOUS) IMPLANT
EXTRACTOR VACUUM M CUP 4 TUBE (SUCTIONS) IMPLANT
GAUZE SPONGE 4X4 12PLY STRL LF (GAUZE/BANDAGES/DRESSINGS) ×2 IMPLANT
GLOVE BIO SURGEON STRL SZ7 (GLOVE) ×2 IMPLANT
GLOVE BIOGEL PI IND STRL 7.0 (GLOVE) ×2 IMPLANT
GLOVE BIOGEL PI INDICATOR 7.0 (GLOVE) ×2
GOWN STRL REUS W/TWL LRG LVL3 (GOWN DISPOSABLE) ×4 IMPLANT
HOVERMATT SINGLE USE (MISCELLANEOUS) ×1 IMPLANT
KIT ABG SYR 3ML LUER SLIP (SYRINGE) IMPLANT
NDL HYPO 25X5/8 SAFETYGLIDE (NEEDLE) IMPLANT
NEEDLE HYPO 25X5/8 SAFETYGLIDE (NEEDLE) IMPLANT
NS IRRIG 1000ML POUR BTL (IV SOLUTION) ×2 IMPLANT
PACK C SECTION WH (CUSTOM PROCEDURE TRAY) ×2 IMPLANT
PAD ABD 7.5X8 STRL (GAUZE/BANDAGES/DRESSINGS) ×1 IMPLANT
PAD OB MATERNITY 4.3X12.25 (PERSONAL CARE ITEMS) ×2 IMPLANT
PENCIL SMOKE EVAC W/HOLSTER (ELECTROSURGICAL) ×1 IMPLANT
RTRCTR C-SECT PINK 25CM LRG (MISCELLANEOUS) ×1 IMPLANT
STRIP CLOSURE SKIN 1/2X4 (GAUZE/BANDAGES/DRESSINGS) IMPLANT
SUT MNCRL 0 VIOLET CTX 36 (SUTURE) IMPLANT
SUT MON AB-0 CT1 36 (SUTURE) ×4 IMPLANT
SUT MONOCRYL 0 CTX 36 (SUTURE) ×1
SUT PLAIN 0 NONE (SUTURE) IMPLANT
SUT PLAIN 2 0 (SUTURE)
SUT PLAIN 2 0 XLH (SUTURE) ×1 IMPLANT
SUT PLAIN ABS 2-0 CT1 27XMFL (SUTURE) IMPLANT
SUT VIC AB 0 CT1 27 (SUTURE) ×4
SUT VIC AB 0 CT1 27XBRD ANBCTR (SUTURE) ×2 IMPLANT
SUT VIC AB 2-0 CT1 27 (SUTURE) ×2
SUT VIC AB 2-0 CT1 TAPERPNT 27 (SUTURE) ×1 IMPLANT
SUT VIC AB 4-0 KS 27 (SUTURE) ×2 IMPLANT
SUT VICRYL 0 TIES 12 18 (SUTURE) IMPLANT
TOWEL OR 17X24 6PK STRL BLUE (TOWEL DISPOSABLE) ×2 IMPLANT
TRAY FOLEY W/BAG SLVR 14FR LF (SET/KITS/TRAYS/PACK) IMPLANT

## 2017-08-11 NOTE — Op Note (Addendum)
Cesarean Section Procedure Note Victoria MaiersChristina I Tiberio 08/11/2017  Indications: IVF pregnancy, one prior LTCS, IOL 41.2 wks for TOLAC. Arrest of dilatation and descent at 6 cm. CPD. Failed TOLAC.   Pre-operative Diagnosis: repeat cesarean section related to arrest of dilation and descent.   Post-operative Diagnosis: Same   Surgeon: Shea EvansMody, Lemoyne Scarpati, MD  Assistants: Arlan Organaniela Paul, CNM  Anesthesia: epidural   Procedure Details:  The patient was seen in the Holding Room. The risks, benefits, complications, treatment options, and expected outcomes were discussed with the patient. The patient concurred with the proposed plan, giving informed consent. identified as Victoria Maiershristina I Axe and the procedure verified as C-Section Delivery. A Time Out was held and the above information confirmed. 3 gm Ancef given.  After induction of anesthesia, the patient was draped and prepped in the usual sterile manner. A Pfannenstiel Incision was made and carried down through the subcutaneous tissue to the fascia. Fascial incision was made and extended transversely. The fascia was separated from the underlying rectus tissue superiorly and inferiorly. The peritoneum was identified and entered. Peritoneal incision was extended longitudinally. Alexis retractor placed. The utero-vesical peritoneal reflection was incised transversely and the bladder flap was bluntly freed from the lower uterine segment. A low transverse uterine incision was made. Thick meconium drained. Delivered from cephalic presentation was a baby BOY with large caput, at 2.04 am on 08/11/2017 with Apgar scores of 8 at one minute and 9 at five minutes. Mouth, nares bulb suctioned. Delayed cord clamping at 1 minute and baby handed to NICU team. Cord ph was not sent. Cord blood was obtained for evaluation. The placenta was removed Intact and appeared normal but with meconium staining, sent to path. The uterine outline noted brink bleeding from several lower segment sinuses  from the lower incision edge but no extension noted. Both tubes and ovaries appeared normal. The uterine incision was closed with running locked sutures of 0Monocryl followed by a second imbricating layer. Hemostasis was observed. Alexis removed after placing Interceed on hysterotomy closure. Peritoneum closure with 2-0 Vicryl. The fascia was then reapproximated with running sutures of 0Vicryl. The subcuticular closure was performed using 2-0plain gut. The skin was closed with 4-0Vicryl. Steristrips and pressure dressing placed.   Instrument, sponge, and needle counts were correct prior the abdominal closure and were correct at the conclusion of the case.   Findings: BOY, delivered cephalic from ROT position at 4.092.04 am on 08/11/17 from low transverse hysterotomy. No scar dehiscence noted. Thick meconium. Normal ovaries and tubes.    Estimated Blood Loss: 1334 mL   Total IV Fluids: 1300 ml   Urine Output: 50CC OF clear urine but concentrated  Specimens: Placenta, cord blood   Complications: no complications  Disposition: PACU - hemodynamically stable.   Maternal Condition: stable   Baby condition / location:  Couplet care / Skin to Skin  Attending Attestation: I performed the procedure.   Signed: Surgeon(s): Shea EvansMody, Sigifredo Pignato, MD

## 2017-08-11 NOTE — Progress Notes (Signed)
Urine output of 300 ml in 12 hours called to Dr. Juliene PinaMody. Patient is tolerating po intake now, has increased drinking fluids and tolerating OOB well. No orders, instructed to have patient drink fluids.

## 2017-08-11 NOTE — Anesthesia Preprocedure Evaluation (Signed)
Anesthesia Evaluation  Patient identified by MRN, date of birth, ID band Patient awake    Reviewed: Allergy & Precautions, H&P , NPO status , Patient's Chart, lab work & pertinent test results  History of Anesthesia Complications Negative for: history of anesthetic complications  Airway Mallampati: III  TM Distance: >3 FB Neck ROM: full    Dental no notable dental hx. (+) Teeth Intact   Pulmonary neg pulmonary ROS,    Pulmonary exam normal breath sounds clear to auscultation       Cardiovascular Normal cardiovascular exam Rhythm:regular Rate:Normal     Neuro/Psych PSYCHIATRIC DISORDERS (bipolar 1, anxiety) negative neurological ROS     GI/Hepatic negative GI ROS, Neg liver ROS,   Endo/Other  Morbid obesity  Renal/GU negative Renal ROS  negative genitourinary   Musculoskeletal   Abdominal (+) + obese,   Peds  Hematology  (+) anemia ,   Anesthesia Other Findings   Reproductive/Obstetrics (+) Pregnancy                             Anesthesia Physical  Anesthesia Plan  ASA: III  Anesthesia Plan: Epidural   Post-op Pain Management:    Induction:   PONV Risk Score and Plan: Ondansetron, Dexamethasone and Scopolamine patch - Pre-op  Airway Management Planned: Natural Airway and Nasal Cannula  Additional Equipment:   Intra-op Plan:   Post-operative Plan:   Informed Consent: I have reviewed the patients History and Physical, chart, labs and discussed the procedure including the risks, benefits and alternatives for the proposed anesthesia with the patient or authorized representative who has indicated his/her understanding and acceptance.     Plan Discussed with: CRNA and Surgeon  Anesthesia Plan Comments:         Anesthesia Quick Evaluation

## 2017-08-11 NOTE — Anesthesia Postprocedure Evaluation (Signed)
Anesthesia Post Note  Patient: Victoria MaiersChristina I Knight  Procedure(s) Performed: CESAREAN SECTION (N/A )     Patient location during evaluation: Mother Baby Anesthesia Type: Epidural Level of consciousness: awake and alert and oriented Pain management: satisfactory to patient Vital Signs Assessment: post-procedure vital signs reviewed and stable Respiratory status: respiratory function stable Cardiovascular status: stable Postop Assessment: no headache, no backache, epidural receding, patient able to bend at knees, no signs of nausea or vomiting and adequate PO intake Anesthetic complications: no    Last Vitals:  Vitals:   08/11/17 0525 08/11/17 0635  BP: (!) 156/74 137/68  Pulse: 67 65  Resp: 18 20  Temp: 37.5 C 37.4 C  SpO2: 97%     Last Pain:  Vitals:   08/11/17 0635  TempSrc: Oral  PainSc: 1    Pain Goal:                 [name]

## 2017-08-11 NOTE — Anesthesia Postprocedure Evaluation (Signed)
Anesthesia Post Note  Patient: Danella MaiersChristina I Sawyer  Procedure(s) Performed: CESAREAN SECTION (N/A )     Patient location during evaluation: PACU Anesthesia Type: Epidural Level of consciousness: awake Pain management: pain level controlled Vital Signs Assessment: post-procedure vital signs reviewed and stable Respiratory status: spontaneous breathing Cardiovascular status: stable Postop Assessment: no headache, no backache, epidural receding, patient able to bend at knees and no apparent nausea or vomiting Anesthetic complications: no    Last Vitals:  Vitals:   08/11/17 0405 08/11/17 0425  BP:  (!) 152/70  Pulse: 67 68  Resp: 17 18  Temp:  37.6 C  SpO2: 96% 100%    Last Pain:  Vitals:   08/11/17 0425  TempSrc: Oral  PainSc: 1    Pain Goal:                 Bracken Moffa JR,JOHN Allister Lessley

## 2017-08-11 NOTE — Lactation Note (Signed)
This note was copied from a baby's chart. Lactation Consultation Note  Patient Name: Boy Yevette EdwardsChristina Stailey ZOXWR'UToday's Date: 08/11/2017 Reason for consult: Initial assessment;Term Initial assessment done.  This is mom's second baby.  She breast fed and pumped for 8 months but also needed to supplement with formula.  Baby is 10 hours old and has been to breast frequently.  Mom has erect nipples and compressible breasts.  Mom states she knows how to hand express.  Symphony pump set up and initiated due to history of low milk supply.  Instructed to feed baby with any cue and do skin to skin often.  Mom has a Spectra pump at home.  Questions answered.  Breastfeeding consultation services and support information given and reviewed.  Encouraged to call out for assist/concerns prn.  Maternal Data Has patient been taught Hand Expression?: Yes Does the patient have breastfeeding experience prior to this delivery?: Yes  Feeding    LATCH Score                   Interventions    Lactation Tools Discussed/Used Pump Review: Setup, frequency, and cleaning Initiated by:: LM Date initiated:: 08/12/17   Consult Status Consult Status: Follow-up Date: 08/12/17 Follow-up type: In-patient    Huston FoleyMOULDEN, Jaise Moser S 08/11/2017, 1:00 PM

## 2017-08-11 NOTE — Progress Notes (Signed)
PPD #0 RC./s, Boy Subjective: Postpartum Day 0 Emergency Repeat Low transverse Cesarean Delivery Patient reports nausea.  No appetite, feel tired  Objective: Vital signs in last 24 hours: Temp:  [98.1 F (36.7 C)-99.7 F (37.6 C)] 99.3 F (37.4 C) (07/29 0635) Pulse Rate:  [25-88] 65 (07/29 0635) Resp:  [11-27] 20 (07/29 0635) BP: (106-164)/(55-96) 137/68 (07/29 0635) SpO2:  [89 %-100 %] 97 % (07/29 0525)  Physical Exam:  General: alert Lochia: appropriate Uterine Fundus: firm Incision: no significant drainage DVT Evaluation: No evidence of DVT seen on physical exam.  Recent Labs    08/10/17 1839 08/11/17 0643  HGB 10.7* 9.4*  HCT 33.0* 28.8*   O(+) Rum Imm   Assessment/Plan: Status post Cesarean section. Doing well postoperatively.  Continue current care.  Victoria Knight 08/11/2017, 11:45 AM

## 2017-08-11 NOTE — Addendum Note (Signed)
Addendum  created 08/11/17 0941 by Keilah Lemire O, CRNA   Sign clinical note    

## 2017-08-11 NOTE — Consult Note (Signed)
Neonatology Note:   Attendance at C-section:    I was asked by Dr. Juliene PinaMody to attend this repeat C/S at 41 weeks [facility]. The mother is a G2P1 O pos, GBS neg with IVF pregnancy, PCOS, and obesity. ROM 7 hours prior to delivery, fluid meconium-stained. Infant vigorous with good spontaneous cry and tone. Delayed cord clamping was done. Needed bulb suctioning for retrieval of some thick, light green mucous. Ap 8/9. Lungs clear to ausc in DR. Infant is able to remain with his mother for skin to skin time under nursing supervision. Transferred to the care of Pediatrician.   Doretha Souhristie C. Steffen Hase, MD

## 2017-08-11 NOTE — Transfer of Care (Signed)
Immediate Anesthesia Transfer of Care Note  Patient: Victoria Knight  Procedure(s) Performed: CESAREAN SECTION (N/A )  Patient Location: PACU  Anesthesia Type:Epidural  Level of Consciousness: awake, alert , oriented and patient cooperative  Airway & Oxygen Therapy: Patient Spontanous Breathing  Post-op Assessment: Report given to RN  Post vital signs: Reviewed and stable  Last Vitals:  Vitals Value Taken Time  BP    Temp    Pulse 72 08/11/2017  3:08 AM  Resp    SpO2 97 % 08/11/2017  3:08 AM  Vitals shown include unvalidated device data.  Last Pain:  Vitals:   08/11/17 0113  TempSrc: Oral  PainSc:          Complications: No apparent anesthesia complications

## 2017-08-11 NOTE — Progress Notes (Signed)
Victoria Knight is a 32 y.o. G2P1001 at 7959w3d, here for TOLAC Subjective: Pain was getting worse, rebolus helped.   Objective: BP 132/71   Pulse 66   Temp 99 F (37.2 C) (Oral)   Resp 20   Ht 5\' 1"  (1.549 m)   Wt 282 lb 8 oz (128.1 kg)   SpO2 99%   BMI 53.38 kg/m  No intake/output data recorded. Total I/O In: -  Out: 300 [Urine:300]  FHT:  FHR: 150 bpm, variability: moderate,  accelerations:  Present,  decelerations:  Present variable and early decels. change in baseline from 120 to 150 UC:   irregular, every 3-5 minutes, pitocin was turned off 1 hr back due to repetitive variable decels SVE:   Dilation: 6 Effacement (%): 70 Station: -2 Exam by:: Victoria HarmsAngela Anthony, Victoria Knight Dr Juliene PinaMody repeat exam same as above, caput +, no descent, station -3   Labs: Lab Results  Component Value Date   WBC 10.5 08/10/2017   HGB 10.7 (L) 08/10/2017   HCT 33.0 (L) 08/10/2017   MCV 82.9 08/10/2017   PLT 194 08/10/2017    Assessment / Plan: Failed TOLAC, failure to dilate and descent, change in FHR baseline.. Proceed with Repeat C/section now. Pt understands and agrees.  Risks/complications of surgery reviewed incl infection, bleeding, damage to internal organs including bladder, bowels, ureters, blood vessels, other risks from anesthesia, VTE and delayed complications of any surgery, complications in future surgery reviewed. Also discussed neonatal complications incl difficult delivery, laceration, vacuum assistance, TTN etc. Pt understands and agrees, all concerns addressed.     Victoria Knight 08/11/2017, 1:03 AM

## 2017-08-12 DIAGNOSIS — D509 Iron deficiency anemia, unspecified: Secondary | ICD-10-CM | POA: Diagnosis present

## 2017-08-12 MED ORDER — MAGNESIUM OXIDE 400 (241.3 MG) MG PO TABS
400.0000 mg | ORAL_TABLET | Freq: Every day | ORAL | Status: DC
  Administered 2017-08-12 – 2017-08-13 (×2): 400 mg via ORAL
  Filled 2017-08-12 (×2): qty 1

## 2017-08-12 MED ORDER — IBUPROFEN 800 MG PO TABS
800.0000 mg | ORAL_TABLET | Freq: Four times a day (QID) | ORAL | Status: DC
  Administered 2017-08-12 – 2017-08-13 (×5): 800 mg via ORAL
  Filled 2017-08-12 (×5): qty 1

## 2017-08-12 MED ORDER — HYDROCHLOROTHIAZIDE 12.5 MG PO CAPS
12.5000 mg | ORAL_CAPSULE | Freq: Every day | ORAL | Status: DC
  Administered 2017-08-13: 12.5 mg via ORAL
  Filled 2017-08-12: qty 1

## 2017-08-12 MED ORDER — POLYSACCHARIDE IRON COMPLEX 150 MG PO CAPS
150.0000 mg | ORAL_CAPSULE | Freq: Every day | ORAL | Status: DC
  Administered 2017-08-13: 150 mg via ORAL
  Filled 2017-08-12: qty 1

## 2017-08-12 MED ORDER — HYDROCHLOROTHIAZIDE 25 MG PO TABS
25.0000 mg | ORAL_TABLET | ORAL | Status: AC
  Administered 2017-08-12: 25 mg via ORAL
  Filled 2017-08-12: qty 1

## 2017-08-12 NOTE — Progress Notes (Addendum)
POSTOPERATIVE DAY # 1 S/P CS - failed TOLAC with arrest of dilation  S:         Reports feeling sore and aching / feet really swollen & painful             Tolerating po intake / no nausea / no vomiting / + flatus / no BM             Bleeding is light             Pain controlled with Motrin             Up ad lib / ambulatory/ voiding QS  Newborn Breast / Circumcision planned  O:  VS: BP 131/70 (BP Location: Right Arm)   Pulse 82   Temp 98.5 F (36.9 C) (Oral)   Resp 18   Ht 5\' 1"  (1.549 m)   Wt 128.1 kg (282 lb 8 oz)   SpO2 98%   Breastfeeding? Unknown   BMI 53.38 kg/m    LABS:               Recent Labs    08/10/17 1839 08/11/17 0643  WBC 10.5 15.7*  HGB 10.7* 9.4*  PLT 194 180               Bloodtype: --/--/O POS (07/28 1336)                                     I&O: Intake/Output      07/29 0701 - 07/30 0700 07/30 0701 - 07/31 0700   P.O. 360    I.V. (mL/kg) 1250 (9.8)    Total Intake(mL/kg) 1610 (12.6)    Urine (mL/kg/hr) 2075 (0.7)    Blood     Total Output 2075    Net -465                    Physical Exam:             Alert and Oriented X3  Lungs: Clear and unlabored  Heart: regular rate and rhythm / no mumurs  Abdomen: soft, non-tender, mildly distended, hypoactive             Fundus: firm, non-tender, Ueven             Dressing intact without drainage             Perineum: intact  Lochia: light  Extremities: 3+ edema, no calf pain or tenderness  A:        POD # 1 S/P CS            IDA of pregnancy            Dependent edema  P:        Routine postoperative care              Iron and magnesium             HCTZ - increase PO water intake / elevate feet on pillows in bed             Consider early DC  Addendum: social work visit in process - depression score 12 - consider short interval follow-up, PP support groups, counseling PP, medication management.   Victoria Knight CNM, MSN, [facility]ACNM 08/12/2017, 10:28 AM

## 2017-08-12 NOTE — Progress Notes (Signed)
CSW received consult due to score of 12 on the Edinburgh Depression Screen.   CSW met with MOB and FOB in MOB's first floor room/130 to offer support and complete assessment.  Parents were quiet, but pleasant, welcoming and easy to engage.  MOB was breast feeding infant and stated that this was a good time to talk with her.  FOB was cleaning up the room and involved in the conversation.   MOB reports that she was hoping for a vaginal delivery after having her first child by c-section.  She states "I tried," and feels good about that even though she ended up having to deliver again by c-section again.  She reports feeling well overall, but very tired.  She reports not getting much sleep and although FOB has tried to help with baby so that she can rest, he states that all baby wants to do is eat.  MOB agreed, but they both plan to continue to try to let MOB rest.   MOB reports that she experienced anxiety in college and attended counseling.  She reports no mental health symptoms since that time.  She states there is a strong family hx of Bipolar disorder.  She states she did not experience PPD after her first baby was born 5 years ago, but feels his birth caused her to have ill feelings towards her husband "for no reason."  She reports that she did not know what she was experiencing and that it lasted about a year.  She states she didn't understand what was going on until looking back on it later.  We spoke at length about this and other symptoms as well as the importance of monitoring for symptoms and speaking to a medical provider if concerns arise.  MOB reports no emotional concerns at this time other than feeling exhausted.  CSW spoke about the importance of striving to meet basic needs such as eating and sleeping.  CSW asked MOB how she felt when she completed the Edinburgh Postnatal Depression Scale and she states that "I was exhausted and miserable during the last 7 days of my pregnancy," but again states no  current emotional concerns. CSW provided education regarding Baby Blues vs PMADs and provided MOB with information about counseling resources should she decide she would like to seek counseling at any time.  CSW encouraged MOB to evaluate her mental health throughout the postpartum period with the use of the New Mom Checklist developed by [facility] [employer] as well as with the Edinburgh Postnatal Depression Scale and informed FOB that he can utilize these screens as he is adjusting to having a newborn again also.  Parents were appreciative. CSW reviewed SIDS precautions.  Parents state they have a good support system.  FOB works two jobs, but states that they are flexible and will be off of work until at leave 08/18/17.  MOB will have 16 weeks off from work.  CSW identifies no further interventions needed and no barriers to discharge.   

## 2017-08-12 NOTE — Lactation Note (Addendum)
This note was copied from a baby's chart. Lactation Consultation Note  Patient Name: Victoria Yevette EdwardsChristina Knoke ONGEX'BToday's Date: 08/12/2017    P2 Baby 41 hours old.   Mother has slight cracks on base of R nipple and is tender. She is using coconut oil and ebm.  Provided mother w/ comfort gels. For soreness suggest mother apply ebm or coconut oil and alternate with comfort gels. Mother recently pumped approx 7 ml of colostrum and will give back to baby at next feeding w/ spoon.       Maternal Data    Feeding Feeding Type: Breast Milk Length of feed: 10 min  LATCH Score                   Interventions    Lactation Tools Discussed/Used     Consult Status      Hardie PulleyBerkelhammer, Ruth Boschen 08/12/2017, 7:37 PM

## 2017-08-12 NOTE — Anesthesia Postprocedure Evaluation (Signed)
Anesthesia Post Note  Patient: Victoria MaiersChristina I Fernicola  Procedure(s) Performed: AN AD HOC LABOR EPIDURAL     Anesthesia Type: Epidural Level of consciousness: awake Pain management: pain level controlled Respiratory status: spontaneous breathing Cardiovascular status: stable Postop Assessment: no backache, epidural receding, no apparent nausea or vomiting and patient able to bend at knees Anesthetic complications: no    Last Vitals:  Vitals:   08/12/17 0050 08/12/17 0510  BP: (!) 119/59 131/70  Pulse: 74 82  Resp: 18 18  Temp: 36.9 C 36.9 C  SpO2: 100% 98%    Last Pain:  Vitals:   08/12/17 0510  TempSrc: Oral  PainSc: 0-No pain   Pain Goal:                 Gaje Tennyson JR,JOHN Damacio Weisgerber

## 2017-08-13 MED ORDER — OXYCODONE-ACETAMINOPHEN 5-325 MG PO TABS: 2.0000 | ORAL_TABLET | Freq: Four times a day (QID) | ORAL | 0 refills | Status: DC | PRN

## 2017-08-13 MED ORDER — HYDROCHLOROTHIAZIDE 12.5 MG PO CAPS: 12.5000 mg | ORAL_CAPSULE | Freq: Every day | ORAL | 0 refills | Status: DC

## 2017-08-13 MED ORDER — IBUPROFEN 800 MG PO TABS: 800.0000 mg | ORAL_TABLET | Freq: Four times a day (QID) | ORAL | 0 refills | Status: DC

## 2017-08-13 MED ORDER — SIMETHICONE 80 MG PO CHEW: 80.0000 mg | CHEWABLE_TABLET | ORAL | 0 refills | Status: DC | PRN

## 2017-08-13 MED ORDER — MAGNESIUM OXIDE 400 (241.3 MG) MG PO TABS: 400.0000 mg | ORAL_TABLET | Freq: Every day | ORAL | 2 refills | Status: DC

## 2017-08-13 NOTE — Lactation Note (Signed)
This note was copied from a baby's chart. Lactation Consultation Note:  Mother reports that infant has not voided enough to be discharged this am;, she reports that she my have a discharge this p,m. Infant has had 3 wets and 3 stools.  Mother reports that infant has been cluster feeding. Infant in mothers arms cuing.   Assist mother with latching infant on the left breast in cradle hold. Infant compresses the lower half of mothers nipple.  Advised mother that infant needs a deeper latch. Infant placed in football hold. Infant sustained latch for 10 mins.  Observed suckling and audible swallows.   Mother has bilateral cracking behind the nipple shaft. Recommend APNO RX. Mother to continue to hand express and rotate positions frequently . Mother to use good firm support with pillows. Mother to hand express in spoon and supplement infant . Advised mother to post pump after each feeding and supplement with any amts of ebm. Mother has DEBP at home as well as hand pump.   Mother to continue to feed infant at least 8-12 times in 24 hours and with feeding cues.   Mother is aware of available LC services , [facility], outpt dept [facility]  Patient Name: Victoria Yevette EdwardsChristina Freas EAVWU'JToday's Date: 08/13/2017 Reason for consult: Follow-up assessment   Maternal Data    Feeding Feeding Type: Breast Fed Length of feed: 30 min(on and off)  LATCH Score Latch: Grasps breast easily, tongue down, lips flanged, rhythmical sucking.  Audible Swallowing: A few with stimulation  Type of Nipple: Everted at rest and after stimulation  Comfort (Breast/Nipple): Soft / non-tender  Hold (Positioning): No assistance needed to correctly position infant at breast.  LATCH Score: 9  Interventions    Lactation Tools Discussed/Used     Consult Status      Victoria Knight, Victoria Knight 08/13/2017, 9:07 AM

## 2017-08-13 NOTE — Discharge Summary (Signed)
Obstetric Discharge Summary  Patient ID: Victoria Knight MRN: [id number] DOB/AGE: [birth year] 32 y.o.   Date of Admission: 08/10/2017  Date of Discharge:  08/13/17  Admitting Diagnosis: Induction of labor at 5192w3d  Secondary Diagnosis: IVF pregnancy, History of PCOS, Obesity in Pregnancy, Anemia in pregnancy, History of Lap Band, History of previous cesarean section for FTP  Mode of Delivery: repeat cesarean section- low uterine, transverse     Discharge Diagnosis: Failed induction of labor   Intrapartum Procedures: Atificial rupture of membranes, epidural and pitocin augmentation   Post partum procedures: None  Complications: None   Brief Hospital Course   Victoria Knight who underwent cesarean section on 08/11/2017.  Patient had an uncomplicated surgery; for further details of this surgery, please refer to the operative note.  Patient had an uncomplicated postpartum course.  By time of discharge on PPD#2, her pain was controlled on oral pain medications; she had appropriate lochia and was ambulating, voiding without difficulty, tolerating regular diet and passing flatus.   She was deemed stable for discharge to home.    Labs: CBC Latest Ref Rng & Units 08/11/2017 08/10/2017 08/10/2017  WBC 4.0 - 10.5 K/uL 15.7(H) 10.5 8.5  Hemoglobin 12.0 - 15.0 g/dL 1.4(N9.4(L) 10.7(L) 10.6(L)  Hematocrit 36.0 - 46.0 % 28.8(L) 33.0(L) 32.8(L)  Platelets 150 - 400 K/uL 180 194 217   O POS  Physical exam:   Temp:  [98.4 F (36.9 C)-99 F (37.2 C)] 98.4 F (36.9 C) (07/31 0525) Pulse Rate:  [75-81] 75 (07/31 0525) Resp:  [16-18] 18 (07/31 0525) BP: (131-140)/(58-88) 140/88 (07/31 0525) SpO2:  [99 %-100 %] 100 % (07/31 0018)  General: alert and no distress  Lochia: appropriate  Abdomen: soft, NT  Uterine Fundus: firm  Incision: covered by dressing; healing well, no significant drainage, no dehiscence, no significant erythema  Extremities: No evidence of DVT seen on  physical exam. Generalized lower extremity edema present.   Discharge Instructions: Per After Visit Summary.  Activity: Advance as tolerated. Pelvic rest for 6 weeks.  Also refer to After Visit Summary  Diet: Regular  Medications: Allergies as of 08/13/2017   No Known Allergies     Medication List    TAKE these medications   ferrous sulfate 325 (65 FE) MG tablet Take 325 mg by mouth daily with breakfast.   hydrochlorothiazide 12.5 MG capsule Commonly known as:  MICROZIDE Take 1 capsule (12.5 mg total) by mouth daily.   ibuprofen 800 MG tablet Commonly known as:  ADVIL,MOTRIN Take 1 tablet (800 mg total) by mouth every 6 (six) hours.   magnesium oxide 400 (241.3 Mg) MG tablet Commonly known as:  MAG-OX Take 1 tablet (400 mg total) by mouth daily.   oxyCODONE-acetaminophen 5-325 MG tablet Commonly known as:  PERCOCET/ROXICET Take 2 tablets by mouth every 6 (six) hours as needed (pain scale > 7).   pantoprazole 20 MG tablet Commonly known as:  PROTONIX Take 20 mg by mouth 2 (two) times daily.   prenatal multivitamin Tabs tablet Take 1 tablet by mouth daily at 12 noon.   simethicone 80 MG chewable tablet Commonly known as:  MYLICON Chew 1 tablet (80 mg total) by mouth as needed for flatulence.            Discharge Care Instructions  (From admission, onward)        Start     Ordered   08/13/17 0000  Discharge wound care:    Comments:  Remove honeycomb dressing  in five (5) days. Remove steri strips in two (2) weeks.   08/13/17 1033     Outpatient follow up:  Follow-up Information    [facility] Follow up.   Why:  The office will call you to schedule a one (1) week blood pressure and mood check.  Contact information: [address] [place] [address] [phone]        Shea Evans, MD Follow up.   Specialty:  Obstetrics and Gynecology Why:  The office will call you to schedule a six (6) week postpartum visit with Dr. Talbert Forest  information: [address] [place] [address] [phone]          Discharged Condition: stable  Discharged to: home   Newborn Data:  Disposition:home with mother  Apgars: APGAR (1 MIN): 8   APGAR (5 MINS): 9    Baby Feeding: Breast   Gunnar Bulla, CNM [facility] 08/13/17 10:40 AM

## 2017-08-13 NOTE — Progress Notes (Signed)
   08/13/17 1111  [place]our[place] Rounding  Intervention Call light w/in reach;Comfort measures;Environment secured  Pain Assessment  Pain Assessment 0-10  Pain Score 5  Pain Location Incision  Pain Orientation Lower  Pain Frequency Intermittent  Pain Onset With Activity  Pain Intervention(s) Medication (See eMAR)  Fundal Assessment  Fundal Tone  (PT REFUSED ASSESSMENT)

## 2017-08-13 NOTE — Discharge Instructions (Signed)
Breastfeeding °Choosing to breastfeed is one of the best decisions you can make for yourself and your baby. A change in hormones during pregnancy causes your breasts to make breast milk in your milk-producing glands. Hormones prevent breast milk from being released before your baby is born. They also prompt milk flow after birth. Once breastfeeding has begun, thoughts of your baby, as well as his or her sucking or crying, can stimulate the release of milk from your milk-producing glands. °Benefits of breastfeeding °Research shows that breastfeeding offers many health benefits for infants and mothers. It also offers a cost-free and convenient way to feed your baby. °For your baby °· Your first milk (colostrum) helps your baby's digestive system to function better. °· Special cells in your milk (antibodies) help your baby to fight off infections. °· Breastfed babies are less likely to develop asthma, allergies, obesity, or type 2 diabetes. They are also at lower risk for sudden infant death syndrome (SIDS). °· Nutrients in breast milk are better able to meet your baby’s needs compared to infant formula. °· Breast milk improves your baby's brain development. °For you °· Breastfeeding helps to create a very special bond between you and your baby. °· Breastfeeding is convenient. Breast milk costs nothing and is always available at the correct temperature. °· Breastfeeding helps to burn calories. It helps you to lose the weight that you gained during pregnancy. °· Breastfeeding makes your uterus return faster to its size before pregnancy. It also slows bleeding (lochia) after you give birth. °· Breastfeeding helps to lower your risk of developing type 2 diabetes, osteoporosis, rheumatoid arthritis, cardiovascular disease, and breast, ovarian, uterine, and endometrial cancer later in life. °Breastfeeding basics °Starting breastfeeding °· Find a comfortable place to sit or lie down, with your neck and back  well-supported. °· Place a pillow or a rolled-up blanket under your baby to bring him or her to the level of your breast (if you are seated). Nursing pillows are specially designed to help support your arms and your baby while you breastfeed. °· Make sure that your baby's tummy (abdomen) is facing your abdomen. °· Gently massage your breast. With your fingertips, massage from the outer edges of your breast inward toward the nipple. This encourages milk flow. If your milk flows slowly, you may need to continue this action during the feeding. °· Support your breast with 4 fingers underneath and your thumb above your nipple (make the letter "C" with your hand). Make sure your fingers are well away from your nipple and your baby’s mouth. °· Stroke your baby's lips gently with your finger or nipple. °· When your baby's mouth is open wide enough, quickly bring your baby to your breast, placing your entire nipple and as much of the areola as possible into your baby's mouth. The areola is the colored area around your nipple. °? More areola should be visible above your baby's upper lip than below the lower lip. °? Your baby's lips should be opened and extended outward (flanged) to ensure an adequate, comfortable latch. °? Your baby's tongue should be between his or her lower gum and your breast. °· Make sure that your baby's mouth is correctly positioned around your nipple (latched). Your baby's lips should create a seal on your breast and be turned out (everted). °· It is common for your baby to suck about 2-3 minutes in order to start the flow of breast milk. °Latching °Teaching your baby how to latch onto your breast properly is very   important. An improper latch can cause nipple pain, decreased milk supply, and poor weight gain in your baby. Also, if your baby is not latched onto your nipple properly, he or she may swallow some air during feeding. This can make your baby fussy. Burping your baby when you switch breasts  during the feeding can help to get rid of the air. However, teaching your baby to latch on properly is still the best way to prevent fussiness from swallowing air while breastfeeding. °Signs that your baby has successfully latched onto your nipple °· Silent tugging or silent sucking, without causing you pain. Infant's lips should be extended outward (flanged). °· Swallowing heard between every 3-4 sucks once your milk has started to flow (after your let-down milk reflex occurs). °· Muscle movement above and in front of his or her ears while sucking. ° °Signs that your baby has not successfully latched onto your nipple °· Sucking sounds or smacking sounds from your baby while breastfeeding. °· Nipple pain. ° °If you think your baby has not latched on correctly, slip your finger into the corner of your baby’s mouth to break the suction and place it between your baby's gums. Attempt to start breastfeeding again. °Signs of successful breastfeeding °Signs from your baby °· Your baby will gradually decrease the number of sucks or will completely stop sucking. °· Your baby will fall asleep. °· Your baby's body will relax. °· Your baby will retain a small amount of milk in his or her mouth. °· Your baby will let go of your breast by himself or herself. ° °Signs from you °· Breasts that have increased in firmness, weight, and size 1-3 hours after feeding. °· Breasts that are softer immediately after breastfeeding. °· Increased milk volume, as well as a change in milk consistency and color by the fifth day of breastfeeding. °· Nipples that are not sore, cracked, or bleeding. ° °Signs that your baby is getting enough milk °· Wetting at least 1-2 diapers during the first 24 hours after birth. °· Wetting at least 5-6 diapers every 24 hours for the first week after birth. The urine should be clear or pale yellow by the age of 5 days. °· Wetting 6-8 diapers every 24 hours as your baby continues to grow and develop. °· At least 3  stools in a 24-hour period by the age of 5 days. The stool should be soft and yellow. °· At least 3 stools in a 24-hour period by the age of 7 days. The stool should be seedy and yellow. °· No loss of weight greater than 10% of birth weight during the first 3 days of life. °· Average weight gain of 4-7 oz (113-198 g) per week after the age of 4 days. °· Consistent daily weight gain by the age of 5 days, without weight loss after the age of 2 weeks. °After a feeding, your baby may spit up a small amount of milk. This is normal. °Breastfeeding frequency and duration °Frequent feeding will help you make more milk and can prevent sore nipples and extremely full breasts (breast engorgement). Breastfeed when you feel the need to reduce the fullness of your breasts or when your baby shows signs of hunger. This is called "breastfeeding on demand." Signs that your baby is hungry include: °· Increased alertness, activity, or restlessness. °· Movement of the head from side to side. °· Opening of the mouth when the corner of the mouth or cheek is stroked (rooting). °· Increased sucking sounds,   smacking lips, cooing, sighing, or squeaking. °· Hand-to-mouth movements and sucking on fingers or hands. °· Fussing or crying. ° °Avoid introducing a pacifier to your baby in the first 4-6 weeks after your baby is born. After this time, you may choose to use a pacifier. Research has shown that pacifier use during the first year of a baby's life decreases the risk of sudden infant death syndrome (SIDS). °Allow your baby to feed on each breast as long as he or she wants. When your baby unlatches or falls asleep while feeding from the first breast, offer the second breast. Because newborns are often sleepy in the first few weeks of life, you may need to awaken your baby to get him or her to feed. °Breastfeeding times will vary from baby to baby. However, the following rules can serve as a guide to help you make sure that your baby is  properly fed: °· Newborns (babies 4 weeks of age or younger) may breastfeed every 1-3 hours. °· Newborns should not go without breastfeeding for longer than 3 hours during the day or 5 hours during the night. °· You should breastfeed your baby a minimum of 8 times in a 24-hour period. ° °Breast milk pumping °Pumping and storing breast milk allows you to make sure that your baby is exclusively fed your breast milk, even at times when you are unable to breastfeed. This is especially important if you go back to work while you are still breastfeeding, or if you are not able to be present during feedings. Your lactation consultant can help you find a method of pumping that works best for you and give you guidelines about how long it is safe to store breast milk. °Caring for your breasts while you breastfeed °Nipples can become dry, cracked, and sore while breastfeeding. The following recommendations can help keep your breasts moisturized and healthy: °· Avoid using soap on your nipples. °· Wear a supportive bra designed especially for nursing. Avoid wearing underwire-style bras or extremely tight bras (sports bras). °· Air-dry your nipples for 3-4 minutes after each feeding. °· Use only cotton bra pads to absorb leaked breast milk. Leaking of breast milk between feedings is normal. °· Use lanolin on your nipples after breastfeeding. Lanolin helps to maintain your skin's normal moisture barrier. Pure lanolin is not harmful (not toxic) to your baby. You may also hand express a few drops of breast milk and gently massage that milk into your nipples and allow the milk to air-dry. ° °In the first few weeks after giving birth, some women experience breast engorgement. Engorgement can make your breasts feel heavy, warm, and tender to the touch. Engorgement peaks within 3-5 days after you give birth. The following recommendations can help to ease engorgement: °· Completely empty your breasts while breastfeeding or pumping. You  may want to start by applying warm, moist heat (in the shower or with warm, water-soaked hand towels) just before feeding or pumping. This increases circulation and helps the milk flow. If your baby does not completely empty your breasts while breastfeeding, pump any extra milk after he or she is finished. °· Apply ice packs to your breasts immediately after breastfeeding or pumping, unless this is too uncomfortable for you. To do this: °? Put ice in a plastic bag. °? Place a towel between your skin and the bag. °? Leave the ice on for 20 minutes, 2-3 times a day. °· Make sure that your baby is latched on and positioned properly while   breastfeeding. ° °If engorgement persists after 48 hours of following these recommendations, contact your health care provider or a lactation consultant. °Overall health care recommendations while breastfeeding °· Eat 3 healthy meals and 3 snacks every day. Well-nourished mothers who are breastfeeding need an additional 450-500 calories a day. You can meet this requirement by increasing the amount of a balanced diet that you eat. °· Drink enough water to keep your urine pale yellow or clear. °· Rest often, relax, and continue to take your prenatal vitamins to prevent fatigue, stress, and low vitamin and mineral levels in your body (nutrient deficiencies). °· Do not use any products that contain nicotine or tobacco, such as cigarettes and e-cigarettes. Your baby may be harmed by chemicals from cigarettes that pass into breast milk and exposure to secondhand smoke. If you need help quitting, ask your health care provider. °· Avoid alcohol. °· Do not use illegal drugs or marijuana. °· Talk with your health care provider before taking any medicines. These include over-the-counter and prescription medicines as well as vitamins and herbal supplements. Some medicines that may be harmful to your baby can pass through breast milk. °· It is possible to become pregnant while breastfeeding. If  birth control is desired, ask your health care provider about options that will be safe while breastfeeding your baby. °Where to find more information: °[employer]: www.llli.org °Contact a health care provider if: °· You feel like you want to stop breastfeeding or have become frustrated with breastfeeding. °· Your nipples are cracked or bleeding. °· Your breasts are red, tender, or warm. °· You have: °? Painful breasts or nipples. °? A swollen area on either breast. °? A fever or chills. °? Nausea or vomiting. °? Drainage other than breast milk from your nipples. °· Your breasts do not become full before feedings by the fifth day after you give birth. °· You feel sad and depressed. °· Your baby is: °? Too sleepy to eat well. °? Having trouble sleeping. °? More than 1 week old and wetting fewer than 6 diapers in a 24-hour period. °? Not gaining weight by 5 days of age. °· Your baby has fewer than 3 stools in a 24-hour period. °· Your baby's skin or the white parts of his or her eyes become yellow. °Get help right away if: °· Your baby is overly tired (lethargic) and does not want to wake up and feed. °· Your baby develops an unexplained fever. °Summary °· Breastfeeding offers many health benefits for infant and mothers. °· Try to breastfeed your infant when he or she shows early signs of hunger. °· Gently tickle or stroke your baby's lips with your finger or nipple to allow the baby to open his or her mouth. Bring the baby to your breast. Make sure that much of the areola is in your baby's mouth. Offer one side and burp the baby before you offer the other side. °· Talk with your health care provider or lactation consultant if you have questions or you face problems as you breastfeed. °This information is not intended to replace advice given to you by your health care provider. Make sure you discuss any questions you have with your health care provider. °Document Released: 12/31/2004 Document  Revised: 02/02/2016 Document Reviewed: 02/02/2016 °[employer] Interactive Patient Education © 2018 [employer] °Breastfeeding Challenges and Solutions °Even though breastfeeding is natural, it can be challenging, especially in the first few weeks after childbirth. It is normal for problems to arise when   starting to breastfeed your new baby, even if you have breastfed before. This document provides some solutions to the most common breastfeeding challenges. °Challenges and solutions °Challenge--Cracked or Sore Nipples °Cracked or sore nipples are commonly experienced by breastfeeding mothers. Cracked or sore nipples often are caused by inadequate latching (when your baby's mouth attaches to your breast to breastfeed). Soreness can also happen if your baby is not positioned properly at your breast. Although nipple cracking and soreness are common during the first week after birth, nipple pain is never normal. If you experience nipple cracking or soreness that lasts longer than 1 week or nipple pain, call your health care provider or lactation consultant. °Solution °Ensure proper latching and positioning of your baby by following the steps below: °· Find a comfortable place to sit or lie down, with your neck and back well supported. °· Place a pillow or rolled up blanket under your baby to bring him or her to the level of your breast (if you are seated). °· Make sure that your baby's abdomen is facing your abdomen. °· Gently massage your breast. With your fingertips, massage from your chest wall toward your nipple in a circular motion. This encourages milk flow. You may need to continue this action during the feeding if your milk flows slowly. °· Support your breast with 4 fingers underneath and your thumb above your nipple. Make sure your fingers are well away from your nipple and your baby’s mouth. °· Stroke your baby's lips gently with your finger or nipple. °· When your baby's mouth is open wide enough, quickly  bring your baby to your breast, placing your entire nipple and as much of the colored area around your nipple (areola) as possible into your baby's mouth. °? More areola should be visible above your baby's upper lip than below the lower lip. °? Your baby's tongue should be between his or her lower gum and your breast. °· Ensure that your baby's mouth is correctly positioned around your nipple (latched). Your baby's lips should create a seal on your breast and be turned out (everted). °· It is common for your baby to suck for about 2-3 minutes in order to start the flow of breast milk. ° °Signs that your baby has successfully latched on to your nipple include: °· Quietly tugging or quietly sucking without causing you pain. °· Swallowing heard between every 3-4 sucks. °· Muscle movement above and in front of his or her ears with sucking. ° °Signs that your baby has not successfully latched on to nipple include: °· Sucking sounds or smacking sounds from your baby while nursing. °· Nipple pain. ° °Ensure that your breasts stay moisturized and healthy by: °· Avoiding the use of soap on your nipples. °· Wearing a supportive bra. Avoid wearing underwire-style bras or tight bras. °· Air drying your nipples for 3-4 minutes after each feeding. °· Using only cotton bra pads to absorb breast milk leakage. Leaking of breast milk between feedings is normal. Be sure to change the pads if they become soaked with milk. °· Using lanolin on your nipples after nursing. Lanolin helps to maintain your skin's normal moisture barrier. If you use pure lanolin you do not need to wash it off before feeding your baby again. Pure lanolin is not toxic to your baby. You may also hand express a few drops of breast milk and gently massage that milk into your nipples, allowing it to air dry. ° °Challenge--Breast Engorgement °Breast engorgement is the overfilling   of your breasts with breast milk. In the first few weeks after giving birth, you may  experience breast engorgement. Breast engorgement can make your breasts throb and feel hard, tightly stretched, warm, and tender. Engorgement peaks about the fifth day after you give birth. Having breast engorgement does not mean you have to stop breastfeeding your baby. °Solution °· Breastfeed when you feel the need to reduce the fullness of your breasts or when your baby shows signs of hunger. This is called "breastfeeding on demand." °· Newborns (babies younger than 4 weeks) often breastfeed every 1-3 hours during the day. You may need to awaken your baby to feed if he or she is asleep at a feeding time. °· Do not allow your baby to sleep longer than 5 hours during the night without a feeding. °· Pump or hand express breast milk before breastfeeding to soften your breast, areola, and nipple. °· Apply warm, moist heat (in the shower or with warm water-soaked hand towels) just before feeding or pumping, or massage your breast before or during breastfeeding. This increases circulation and helps your milk to flow. °· Completely empty your breasts when breastfeeding or pumping. Afterward, wear a snug bra (nursing or regular) or tank top for 1-2 days to signal your body to slightly decrease milk production. Only wear snug bras or tank tops to treat engorgement. Tight bras typically should be avoided by breastfeeding mothers. Once engorgement is relieved, return to wearing regular, loose-fitting clothes. °· Apply ice packs to your breasts to lessen the pain from engorgement and relieve swelling, unless the ice is uncomfortable for you. °· Do not delay feedings. Try to relax when it is time to feed your baby. This helps to trigger your "let-down reflex," which releases milk from your breast. °· Ensure your baby is latched on to your breast and positioned properly while breastfeeding. °· Allow your baby to remain at your breast as long as he or she is latched on well and actively sucking. Your baby will let you know when  he or she is done breastfeeding by pulling away from your breast or falling asleep. °· Avoid introducing bottles or pacifiers to your baby in the early weeks of breastfeeding. Wait to introduce these things until after resolving any breastfeeding challenges. °· Try to pump your milk on the same schedule as when your baby would breastfeed if you are returning to work or away from home for an extended period. °· Drink plenty of fluids to avoid dehydration, which can eventually put you at greater risk of breast engorgement. ° °If you follow these suggestions, your engorgement should improve in 24-48 hours. If you are still experiencing difficulty, call your lactation consultant or health care provider. °Challenge--Plugged Milk Ducts °Plugged milk ducts occur when the duct does not drain milk effectively and becomes swollen. Wearing a tight-fitting nursing bra or having difficulty with latching may cause plugged milk ducts. Not drinking enough water (8-10 c [1.9-2.4 L] per day) can contribute to plugged milk ducts. Once a duct has become plugged, hard lumps, soreness, and redness may develop in your breast. °Solution °Do not delay feedings. Feed your baby frequently and try to empty your breasts of milk at each feeding. Try breastfeeding from the affected side first so there is a better chance that the milk will drain completely from that breast. Apply warm, moist towels to your breasts for 5-10 minutes before feeding. Alternatively, a hot shower right before breastfeeding can provide the moist heat that can encourage   milk flow. Gentle massage of the sore area before and during a feeding may also help. Avoid wearing tight clothing or bras that put pressure on your breasts. Wear bras that offer good support to your breasts, but avoid underwire bras. If you have a plugged milk duct and develop a fever, you need to see your health care provider. °Challenge--Mastitis °Mastitis is inflammation of your breast. It usually is  caused by a bacterial infection and can cause flu-like symptoms. You may develop redness in your breast and a fever. Often when mastitis occurs, your breast becomes firm, warm, and very painful. The most common causes of mastitis are poor latching, ineffective sucking from your baby, consistent pressure on your breast (possibly from wearing a tight-fitting bra or shirt that restricts the milk flow), unusual stress or fatigue, or missed feedings. °Solution °You will be given antibiotic medicine to treat the infection. It is still important to breastfeed frequently to empty your breasts. Continuing to breastfeed while you recover from mastitis will not harm your baby. Make sure your baby is positioned properly during every feeding. Apply moist heat to your breasts for a few minutes before feeding to help the milk flow and to help your breasts empty more easily. °Challenge--Thrush °Thrush is a yeast infection that can form on your nipples, in your breast, or in your baby's mouth. It causes itching, soreness, burning or stabbing pain, and sometimes a rash. °Solution °You will be given a medicated ointment for your nipples, and your baby will be given a liquid medicine for his or her mouth. It is important that you and your baby are treated at the same time because thrush can be passed between you and your baby. Change disposable nursing pads often. Any bras, towels, or clothing that come in contact with infected areas of your body or your baby's body need to be washed in very hot water every day. Wash your hands and your baby's hands often. All pacifiers, bottle nipples, or toys your baby puts in his or her mouth should be boiled once a day for 20 minutes. After 1 week of treatment, discard pacifiers and bottle nipples and buy new ones. All breast pump parts that touch the milk need to be boiled for 20 minutes every day. °Challenge--Low Milk Supply °You may not be producing enough milk if your baby is not gaining the  proper amount of weight. Breast milk production is based on a supply-and-demand system. Your milk supply depends on how frequently and effectively your baby empties your breast. °Solution °The more you breastfeed and pump, the more breast milk you will produce. It is important that your baby empties at least one of your breasts at each feeding. If this is not happening, then use a breast pump or hand express any milk that remains. This will help to drain as much milk as possible at each feeding. It will also signal your body to produce more milk. If your baby is not emptying your breasts, it may be due to latching, sucking, or positioning problems. If low milk supply continues after addressing these issues, contact your health care provider or a lactation specialist as soon as possible. °Challenge--Inverted or Flat Nipples °Some women have nipples that turn inward instead of protruding outward. Other women have nipples that are flat. Inverted or flat nipples can sometimes make it more difficult for your baby to latch onto your breast. °Solution °You may be given a small device that pulls out inverted nipples. This device should   be applied right before your baby is brought to your breast. You can also try using a breast pump for a short time before placing the baby at your breast. The pump can pull your nipple outwards to help your infant latch more easily. The baby's sucking motion will help the inverted nipple protrude as well. °If you have flat nipples, encourage your baby to latch onto your breast and feed frequently in the early days after birth. This will give your baby practice latching on correctly while your breast is still soft. When your milk supply increases, between the second and fifth day after birth and your breasts become full, your baby will have an easier time latching. °Contact a lactation consultant if you still have concerns. She or he can teach you additional techniques to address breastfeeding  problems related to nipple shape and position. °Where to find more information: °[employer]: www.llli.org °This information is not intended to replace advice given to you by your health care provider. Make sure you discuss any questions you have with your health care provider. °Document Released: 06/24/2005 Document Revised: 06/14/2015 Document Reviewed: 06/26/2012 °Elsevier Interactive Patient Education © 2017 [employer] °Postpartum Care After Cesarean Delivery °The period of time right after you deliver your newborn is called the postpartum period. °What kind of medical care will I receive? °· You may continue to receive fluids and medicines through an IV tube inserted into one of your veins. °· You may have small, flexible tube (catheter) draining urine from your bladder into a bag outside of your body. The catheter will be removed as soon as possible. °· You may be given a squirt bottle to use when you go to the bathroom. You may use this until you are comfortable wiping as usual. To use the squirt bottle, follow these steps: °? Before you urinate, fill the squirt bottle with warm water. The water should be warm. Do not use hot water. °? After you urinate, while you are sitting on the toilet, use the squirt bottle to rinse the area around your urethra and vaginal opening. This rinses away any urine and blood. °? You may do this instead of wiping. As you start healing, you may use the squirt bottle before wiping yourself. Make sure to wipe gently. °? Fill the squirt bottle with clean water every time you use the bathroom. °· You will be given sanitary pads to wear. °· Your incision will be monitored to make sure it is healing properly. You will be told when it is safe for your stitches, staples, or skin adhesive tape to be removed. °What can I expect? °· You may not feel the need to urinate for several hours after delivery. °· You will have some soreness and pain in your abdomen. You may  have a small amount of blood or clear fluid coming from your incision. °· If you are breastfeeding, you may have uterine contractions every time you breastfeed for up to several weeks postpartum. Uterine contractions help your uterus return to its normal size. °· It is normal to have vaginal bleeding (lochia) after delivery. The amount and appearance of lochia is often similar to a menstrual period in the first week after delivery. It will gradually decrease over the next few weeks to a dry, yellow-brown discharge. For most women, lochia stops completely by 6-8 weeks after delivery. Vaginal bleeding can vary from woman to woman. °· Within the first few days after delivery, you may have breast engorgement. This   is when your breasts feel heavy, full, and uncomfortable. Your breasts may also throb and feel hard, tightly stretched, warm, and tender. After this occurs, you may have milk leaking from your breasts. Your health care provider can help you relieve discomfort due to breast engorgement. Breast engorgement should go away within a few days. °· You may feel more sad or worried than normal due to hormonal changes after delivery. These feelings should not last more than a few days. If these feelings do not go away after several days, speak with your health care provider. °How should I care for myself? °· Tell your health care provider if you have pain or discomfort. °· Drink enough water to keep your urine clear or pale yellow. °· Wash your hands thoroughly with soap and water for at least 20 seconds after changing your sanitary pads or using the toilet, and before holding or feeding your baby. °· If you are not breastfeeding, avoid touching your breasts a lot. Doing this can make your breasts produce more milk. °· If you become weak or lightheaded, or you feel like you might faint, ask for help before: °? Getting out of bed. °? Showering. °· Change your sanitary pads frequently. Watch for any changes in your flow,  such as a sudden increase in volume, a change in color, or the passing of large blood clots. If you pass a blood clot from your vagina, save it to show to your health care provider. Do not flush blood clots down the toilet without having your health care provider look at them. °· Make sure that all your vaccinations are up to date. This can help protect you and your baby from getting certain diseases. You may need to have immunizations done before you leave the hospital. °· If desired, talk with your health care provider about methods of family planning or birth control (contraception). °How can I start bonding with my baby? °Spending as much time as possible with your baby is very important. During this time, you and your baby can get to know each other and develop a bond. Having your baby stay with you in your room (rooming in) can give you time to get to know your baby. Rooming in can also help you become comfortable caring for your baby. Breastfeeding can also help you bond with your baby. °How can I plan for returning home with my baby? °· Make sure that you have a car seat installed in your vehicle. °? Your car seat should be checked by a certified car seat installer to make sure that it is installed safely. °? Make sure that your baby fits into the car seat safely. °· Ask your health care provider any questions you have about caring for yourself or your baby. Make sure that you are able to contact your health care provider with any questions after leaving the hospital. °This information is not intended to replace advice given to you by your health care provider. Make sure you discuss any questions you have with your health care provider. °Document Released: 09/25/2011 Document Revised: 06/05/2015 Document Reviewed: 12/05/2014 °Elsevier Interactive Patient Education © 2018 [employer] °Postpartum Depression and Baby Blues °The postpartum period begins right after the birth of a baby. During this time, there  is often a great amount of joy and excitement. It is also a time of many changes in the life of the parents. Regardless of how many times a mother gives birth, each child brings new challenges and dynamics to   the family. It is not unusual to have feelings of excitement along with confusing shifts in moods, emotions, and thoughts. All mothers are at risk of developing postpartum depression or the "baby blues." These mood changes can occur right after giving birth, or they may occur many months after giving birth. The baby blues or postpartum depression can be mild or severe. Additionally, postpartum depression can go away rather quickly, or it can be a long-term condition. °What are the causes? °Raised hormone levels and the rapid drop in those levels are thought to be a main cause of postpartum depression and the baby blues. A number of hormones change during and after pregnancy. Estrogen and progesterone usually decrease right after the delivery of your baby. The levels of thyroid hormone and various cortisol steroids also rapidly drop. Other factors that play a role in these mood changes include major life events and genetics. °What increases the risk? °If you have any of the following risks for the baby blues or postpartum depression, know what symptoms to watch out for during the postpartum period. Risk factors that may increase the likelihood of getting the baby blues or postpartum depression include: °· Having a personal or family history of depression. °· Having depression while being pregnant. °· Having premenstrual mood issues or mood issues related to oral contraceptives. °· Having a lot of life stress. °· Having marital conflict. °· Lacking a social support network. °· Having a baby with special needs. °· Having health problems, such as diabetes. ° °What are the signs or symptoms? °Symptoms of baby blues include: °· Brief changes in mood, such as going from extreme happiness to sadness. °· Decreased  concentration. °· Difficulty sleeping. °· Crying spells, tearfulness. °· Irritability. °· Anxiety. ° °Symptoms of postpartum depression typically begin within the first month after giving birth. These symptoms include: °· Difficulty sleeping or excessive sleepiness. °· Marked weight loss. °· Agitation. °· Feelings of worthlessness. °· Lack of interest in activity or food. ° °Postpartum psychosis is a very serious condition and can be dangerous. Fortunately, it is rare. Displaying any of the following symptoms is cause for immediate medical attention. Symptoms of postpartum psychosis include: °· Hallucinations and delusions. °· Bizarre or disorganized behavior. °· Confusion or disorientation. ° °How is this diagnosed? °A diagnosis is made by an evaluation of your symptoms. There are no medical or lab tests that lead to a diagnosis, but there are various questionnaires that a health care provider may use to identify those with the baby blues, postpartum depression, or psychosis. Often, a screening tool called the Edinburgh Postnatal Depression Scale is used to diagnose depression in the postpartum period. °How is this treated? °The baby blues usually goes away on its own in 1-2 weeks. Social support is often all that is needed. You will be encouraged to get adequate sleep and rest. Occasionally, you may be given medicines to help you sleep. °Postpartum depression requires treatment because it can last several months or longer if it is not treated. Treatment may include individual or group therapy, medicine, or both to address any social, physiological, and psychological factors that may play a role in the depression. Regular exercise, a healthy diet, rest, and social support may also be strongly recommended. °Postpartum psychosis is more serious and needs treatment right away. Hospitalization is often needed. °Follow these instructions at home: °· Get as much rest as you can. Nap when the baby sleeps. °· Exercise  regularly. Some women find yoga and walking to be beneficial. °·   Eat a balanced and nourishing diet. °· Do little things that you enjoy. Have a cup of tea, take a bubble bath, read your favorite magazine, or listen to your favorite music. °· Avoid alcohol. °· Ask for help with household chores, cooking, grocery shopping, or running errands as needed. Do not try to do everything. °· Talk to people close to you about how you are feeling. Get support from your partner, family members, friends, or other new moms. °· Try to stay positive in how you think. Think about the things you are grateful for. °· Do not spend a lot of time alone. °· Only take over-the-counter or prescription medicine as directed by your health care provider. °· Keep all your postpartum appointments. °· Let your health care provider know if you have any concerns. °Contact a health care provider if: °You are having a reaction to or problems with your medicine. °Get help right away if: °· You have suicidal feelings. °· You think you may harm the baby or someone else. °This information is not intended to replace advice given to you by your health care provider. Make sure you discuss any questions you have with your health care provider. °Document Released: 10/05/2003 Document Revised: 06/08/2015 Document Reviewed: 10/12/2012 °Elsevier Interactive Patient Education © 2017 [employer] °Home Care Instructions for Mom °ACTIVITY °· Gradually return to your regular activities. °· Let yourself rest. Nap while your baby sleeps. °· Avoid lifting anything that is heavier than 10 lb (4.5 kg) until your health care provider says it is okay. °· Avoid activities that take a lot of effort and energy (are strenuous) until approved by your health care provider. Walking at a slow-to-moderate pace is usually safe. °· If you had a cesarean delivery: °? Do not vacuum, climb stairs, or drive a car for 4-6 weeks. °? Have someone help you at home until you feel like you can  do your usual activities yourself. °? Do exercises as told by your health care provider, if this applies. ° °VAGINAL BLEEDING °You may continue to bleed for 4-6 weeks after delivery. Over time, the amount of blood usually decreases and the color of the blood usually gets lighter. However, the flow of bright red blood may increase if you have been too active. If you need to use more than one pad in an hour because your pad gets soaked, or if you pass a large clot: °· Lie down. °· Raise your feet. °· Place a cold compress on your lower abdomen. °· Rest. °· Call your health care provider. ° °If you are breastfeeding, your period should return anytime between 8 weeks after delivery and the time that you stop breastfeeding. If you are not breastfeeding, your period should return 6-8 weeks after delivery. °PERINEAL CARE °The perineal area, or perineum, is the part of your body between your thighs. After delivery, this area needs special care. Follow these instructions as told by your health care provider. °· Take warm tub baths for 15-20 minutes. °· Use medicated pads and pain-relieving sprays and creams as told. °· Do not use tampons or douches until vaginal bleeding has stopped. °· Each time you go to the bathroom: °? Use a peri bottle. °? Change your pad. °? Use towelettes in place of toilet paper until your stitches have healed. °· Do Kegel exercises every day. Kegel exercises help to maintain the muscles that support the vagina, bladder, and bowels. You can do these exercises while you are standing, sitting, or lying down.   To do Kegel exercises: °? Tighten the muscles of your abdomen and the muscles that surround your birth canal. °? Hold for a few seconds. °? Relax. °? Repeat until you have done this 5 times in a row. °· To prevent hemorrhoids from developing or getting worse: °? Drink enough fluid to keep your urine clear or pale yellow. °? Avoid straining when having a bowel movement. °? Take over-the-counter  medicines and stool softeners as told by your health care provider. ° °BREAST CARE °· Wear a tight-fitting bra. °· Avoid taking over-the-counter pain medicine for breast discomfort. °· Apply ice to the breasts to help with discomfort as needed: °? Put ice in a plastic bag. °? Place a towel between your skin and the bag. °? Leave the ice on for 20 minutes or as told by your health care provider. ° °NUTRITION °· Eat a well-balanced diet. °· Do not try to lose weight quickly by cutting back on calories. °· Take your prenatal vitamins until your postpartum checkup or until your health care provider tells you to stop. ° °POSTPARTUM DEPRESSION °You may find yourself crying for no apparent reason and unable to cope with all of the changes that come with having a newborn. This mood is called postpartum depression. Postpartum depression happens because your hormone levels change after delivery. If you have postpartum depression, get support from your partner, friends, and family. If the depression does not go away on its own after several weeks, contact your health care provider. °BREAST SELF-EXAM °Do a breast self-exam each month, at the same time of the month. If you are breastfeeding, check your breasts just after a feeding, when your breasts are less full. If you are breastfeeding and your period has started, check your breasts on day 5, 6, or 7 of your period. °Report any lumps, bumps, or discharge to your health care provider. Know that breasts are normally lumpy if you are breastfeeding. This is temporary, and it is not a health risk. °INTIMACY AND SEXUALITY °Avoid sexual activity for at least 3-4 weeks after delivery or until the brownish-red vaginal flow is completely gone. If you want to avoid pregnancy, use some form of birth control. You can get pregnant after delivery, even if you have not had your period. °SEEK MEDICAL CARE IF: °· You feel unable to cope with the changes that a child brings to your life, and  these feelings do not go away after several weeks. °· You notice a lump, a bump, or discharge on your breast. ° °SEEK IMMEDIATE MEDICAL CARE IF: °· Blood soaks your pad in 1 hour or less. °· You have: °? Severe pain or cramping in your lower abdomen. °? A bad-smelling vaginal discharge. °? A fever that is not controlled by medicine. °? A fever, and an area of your breast is red and sore. °? Pain or redness in your calf. °? Sudden, severe chest pain. °? Shortness of breath. °? Painful or bloody urination. °? Problems with your vision. °· You vomit for 12 hours or longer. °· You develop a severe headache. °· You have serious thoughts about hurting yourself, your child, or anyone else. ° °This information is not intended to replace advice given to you by your health care provider. Make sure you discuss any questions you have with your health care provider. °Document Released: 12/29/1999 Document Revised: 06/08/2015 Document Reviewed: 07/04/2014 °Elsevier Interactive Patient Education © 2017 [employer]. ° °

## 2017-08-17 ENCOUNTER — Encounter (HOSPITAL_COMMUNITY): Payer: Self-pay

## 2017-08-17 ENCOUNTER — Inpatient Hospital Stay (HOSPITAL_COMMUNITY): Payer: BLUE CROSS/BLUE SHIELD

## 2017-08-17 ENCOUNTER — Other Ambulatory Visit: Payer: Self-pay

## 2017-08-17 ENCOUNTER — Inpatient Hospital Stay (HOSPITAL_COMMUNITY)
Admission: AD | Admit: 2017-08-17 | Discharge: 2017-08-17 | Disposition: A | Discharge status: Home / Self Care | Payer: BLUE CROSS/BLUE SHIELD | Source: Ambulatory Visit | Attending: Obstetrics & Gynecology | Admitting: Obstetrics & Gynecology

## 2017-08-17 DIAGNOSIS — O864 Pyrexia of unknown origin following delivery: Secondary | ICD-10-CM | POA: Diagnosis not present

## 2017-08-17 DIAGNOSIS — M545 Low back pain: Secondary | ICD-10-CM | POA: Diagnosis not present

## 2017-08-17 DIAGNOSIS — Z8249 Family history of ischemic heart disease and other diseases of the circulatory system: Secondary | ICD-10-CM | POA: Insufficient documentation

## 2017-08-17 DIAGNOSIS — K760 Fatty (change of) liver, not elsewhere classified: Secondary | ICD-10-CM | POA: Diagnosis not present

## 2017-08-17 DIAGNOSIS — R51 Headache: Secondary | ICD-10-CM | POA: Insufficient documentation

## 2017-08-17 DIAGNOSIS — Z79891 Long term (current) use of opiate analgesic: Secondary | ICD-10-CM | POA: Insufficient documentation

## 2017-08-17 DIAGNOSIS — Z9889 Other specified postprocedural states: Secondary | ICD-10-CM | POA: Insufficient documentation

## 2017-08-17 DIAGNOSIS — Z833 Family history of diabetes mellitus: Secondary | ICD-10-CM | POA: Insufficient documentation

## 2017-08-17 DIAGNOSIS — M7989 Other specified soft tissue disorders: Secondary | ICD-10-CM | POA: Insufficient documentation

## 2017-08-17 DIAGNOSIS — R918 Other nonspecific abnormal finding of lung field: Secondary | ICD-10-CM | POA: Diagnosis not present

## 2017-08-17 DIAGNOSIS — Z79899 Other long term (current) drug therapy: Secondary | ICD-10-CM | POA: Insufficient documentation

## 2017-08-17 DIAGNOSIS — Z8261 Family history of arthritis: Secondary | ICD-10-CM | POA: Diagnosis not present

## 2017-08-17 DIAGNOSIS — Z98891 History of uterine scar from previous surgery: Secondary | ICD-10-CM

## 2017-08-17 DIAGNOSIS — N12 Tubulo-interstitial nephritis, not specified as acute or chronic: Secondary | ICD-10-CM

## 2017-08-17 LAB — URINALYSIS, ROUTINE W REFLEX MICROSCOPIC
BACTERIA UA: NONE SEEN
BILIRUBIN URINE: NEGATIVE
Bilirubin Urine: NEGATIVE
GLUCOSE, UA: NEGATIVE mg/dL
Glucose, UA: NEGATIVE mg/dL
KETONES UR: NEGATIVE mg/dL
Ketones, ur: NEGATIVE mg/dL
Leukocytes, UA: NEGATIVE
NITRITE: NEGATIVE
Nitrite: NEGATIVE
PH: 8 (ref 5.0–8.0)
PH: 8 (ref 5.0–8.0)
PROTEIN: 30 mg/dL — AB
PROTEIN: NEGATIVE mg/dL
Specific Gravity, Urine: 1.015 (ref 1.005–1.030)
Specific Gravity, Urine: 1.006 (ref 1.005–1.030)

## 2017-08-17 LAB — COMPREHENSIVE METABOLIC PANEL
ALK PHOS: 74 U/L (ref 38–126)
ALT: 35 U/L (ref 0–44)
AST: 33 U/L (ref 15–41)
Albumin: 3.3 g/dL — ABNORMAL LOW (ref 3.5–5.0)
Anion gap: 13 (ref 5–15)
BILIRUBIN TOTAL: 0.5 mg/dL (ref 0.3–1.2)
BUN: 9 mg/dL (ref 6–20)
CALCIUM: 9.1 mg/dL (ref 8.9–10.3)
CO2: 21 mmol/L — ABNORMAL LOW (ref 22–32)
CREATININE: 0.72 mg/dL (ref 0.44–1.00)
Chloride: 101 mmol/L (ref 98–111)
Glucose, Bld: 97 mg/dL (ref 70–99)
Potassium: 3.6 mmol/L (ref 3.5–5.1)
Sodium: 135 mmol/L (ref 135–145)
Total Protein: 6.8 g/dL (ref 6.5–8.1)

## 2017-08-17 LAB — CBC WITH DIFFERENTIAL/PLATELET
BASOS PCT: 0 %
Basophils Absolute: 0 10*3/uL (ref 0.0–0.1)
EOS ABS: 0.1 10*3/uL (ref 0.0–0.7)
Eosinophils Relative: 1 %
HEMATOCRIT: 26 % — AB (ref 36.0–46.0)
Hemoglobin: 8.6 g/dL — ABNORMAL LOW (ref 12.0–15.0)
Lymphocytes Relative: 7 %
Lymphs Abs: 0.8 10*3/uL (ref 0.7–4.0)
MCH: 27 pg (ref 26.0–34.0)
MCHC: 33.1 g/dL (ref 30.0–36.0)
MCV: 81.5 fL (ref 78.0–100.0)
MONOS PCT: 2 %
Monocytes Absolute: 0.2 10*3/uL (ref 0.1–1.0)
NEUTROS ABS: 9.9 10*3/uL — AB (ref 1.7–7.7)
Neutrophils Relative %: 90 %
PLATELETS: 316 10*3/uL (ref 150–400)
RBC: 3.19 MIL/uL — ABNORMAL LOW (ref 3.87–5.11)
RDW: 17.5 % — ABNORMAL HIGH (ref 11.5–15.5)
WBC: 11 10*3/uL — ABNORMAL HIGH (ref 4.0–10.5)

## 2017-08-17 LAB — LACTIC ACID, PLASMA: Lactic Acid, Venous: 0.8 mmol/L (ref 0.5–1.9)

## 2017-08-17 MED ORDER — CEPHALEXIN 500 MG PO CAPS: 500.0000 mg | ORAL_CAPSULE | Freq: Four times a day (QID) | ORAL | 0 refills | Status: AC

## 2017-08-17 MED ORDER — SODIUM CHLORIDE 0.9 % IV SOLN
INTRAVENOUS | Status: DC
  Administered 2017-08-17: 14:00:00 via INTRAVENOUS

## 2017-08-17 MED ORDER — TRAMADOL HCL 50 MG PO TABS
100.0000 mg | ORAL_TABLET | Freq: Once | ORAL | Status: AC
  Administered 2017-08-17: 100 mg via ORAL
  Filled 2017-08-17: qty 2

## 2017-08-17 MED ORDER — CEFTRIAXONE SODIUM 1 G IJ SOLR: 2.0000 g | Freq: Once | INTRAMUSCULAR | Status: DC

## 2017-08-17 MED ORDER — ONDANSETRON HCL 4 MG PO TABS: 4.0000 mg | ORAL_TABLET | Freq: Three times a day (TID) | ORAL | 0 refills | Status: DC | PRN

## 2017-08-17 MED ORDER — TRAMADOL HCL 50 MG PO TABS: 50.0000 mg | ORAL_TABLET | Freq: Four times a day (QID) | ORAL | 0 refills | Status: DC | PRN

## 2017-08-17 MED ORDER — IBUPROFEN 600 MG PO TABS: 600.0000 mg | ORAL_TABLET | Freq: Once | ORAL | Status: DC

## 2017-08-17 MED ORDER — IBUPROFEN 600 MG PO TABS
600.0000 mg | ORAL_TABLET | Freq: Once | ORAL | Status: AC
  Administered 2017-08-17: 600 mg via ORAL
  Filled 2017-08-17: qty 1

## 2017-08-17 MED ORDER — IOPAMIDOL (ISOVUE-300) INJECTION 61%
100.0000 mL | Freq: Once | INTRAVENOUS | Status: AC | PRN
  Administered 2017-08-17: 100 mL via INTRAVENOUS

## 2017-08-17 MED ORDER — IBUPROFEN 800 MG PO TABS: 800.0000 mg | ORAL_TABLET | Freq: Three times a day (TID) | ORAL | 0 refills | Status: DC | PRN

## 2017-08-17 MED ORDER — SODIUM CHLORIDE 0.9 % IV SOLN
2.0000 g | Freq: Once | INTRAVENOUS | Status: AC
  Administered 2017-08-17: 2 g via INTRAVENOUS
  Filled 2017-08-17: qty 20

## 2017-08-17 NOTE — Discharge Instructions (Signed)
Pyelonephritis, Adult Pyelonephritis is a kidney infection. The kidneys are the organs that filter a person's blood and move waste out of the bloodstream and into the urine. Urine passes from the kidneys, through the ureters, and into the bladder. There are two main types of pyelonephritis:  Infections that come on quickly without any warning (acute pyelonephritis).  Infections that last for a long period of time (chronic pyelonephritis).  In most cases, the infection clears up with treatment and does not cause further problems. More severe infections or chronic infections can sometimes spread to the bloodstream or lead to other problems with the kidneys. What are the causes? This condition is usually caused by:  Bacteria traveling from the bladder to the kidney through infected urine. The urine in the bladder can become infected with bacteria from: ? Bladder infection (cystitis). ? Inflammation of the prostate gland (prostatitis). ? Sexual intercourse, in females.  Bacteria traveling from the bloodstream to the kidney.  What increases the risk? This condition is more likely to develop in:  Pregnant women.  Older people.  People who have diabetes.  People who have kidney stones or bladder stones.  People who have other abnormalities of the kidney or ureter.  People who have a catheter placed in the bladder.  People who have cancer.  People who are sexually active.  Women who use spermicides.  People who have had a prior urinary tract infection.  What are the signs or symptoms? Symptoms of this condition include:  Frequent urination.  Strong or persistent urge to urinate.  Burning or stinging when urinating.  Abdominal pain.  Back pain.  Pain in the side or flank area.  Fever.  Chills.  Blood in the urine, or dark urine.  Nausea.  Vomiting.  How is this diagnosed? This condition may be diagnosed based on:  Medical history and physical exam.  Urine  tests.  Blood tests.  You may also have imaging tests of the kidneys, such as an ultrasound or CT scan. How is this treated? Treatment for this condition may depend on the severity of the infection.  If the infection is mild and is found early, you may be treated with antibiotic medicines taken by mouth. You will need to drink fluids to remain hydrated.  If the infection is more severe, you may need to stay in the hospital and receive antibiotics given directly into a vein through an IV tube. You may also need to receive fluids through an IV tube if you are not able to remain hydrated. After your hospital stay, you may need to take oral antibiotics for a period of time.  Other treatments may be required, depending on the cause of the infection. Follow these instructions at home: Medicines  Take over-the-counter and prescription medicines only as told by your health care provider.  If you were prescribed an antibiotic medicine, take it as told by your health care provider. Do not stop taking the antibiotic even if you start to feel better. General instructions  Drink enough fluid to keep your urine clear or pale yellow.  Avoid caffeine, tea, and carbonated beverages. They tend to irritate the bladder.  Urinate often. Avoid holding in urine for long periods of time.  Urinate before and after sex.  After a bowel movement, women should cleanse from front to back. Use each tissue only once.  Keep all follow-up visits as told by your health care provider. This is important. Contact a health care provider if:  Your symptoms   do not get better after 2 days of treatment.  Your symptoms get worse.  You have a fever. Get help right away if:  You are unable to take your antibiotics or fluids.  You have shaking chills.  You vomit.  You have severe flank or back pain.  You have extreme weakness or fainting. This information is not intended to replace advice given to you by your  health care provider. Make sure you discuss any questions you have with your health care provider. Document Released: 12/31/2004 Document Revised: 06/08/2015 Document Reviewed: 04/25/2014 Elsevier Interactive Patient Education  2018 [employer]  

## 2017-08-17 NOTE — MAU Note (Signed)
+   headache all day with no relief with meds + Body aches, temp 99.5. Baby had temp also.  C/S Monday  Shakes, chill At one point two thermometers said 102.2(infared on forehead) & 100.4(under arm) Hard to take deep breath.  No n/v/d + Back pain 7/10 + Lower extremity swelling  Took tylenol around 5am.

## 2017-08-17 NOTE — MAU Provider Note (Addendum)
History     CSN: [id number]  Arrival date and time: 08/17/17 1914   First Provider Initiated Contact with Patient 08/17/17 [phone]      Chief Complaint  Patient presents with  . Fever  . Back Pain  . Shortness of Breath   HPI  Victoria Knight is a 32 y.o. [phone] postpartum patient who presents to MAU with chief complaints of headache, fever and chills, low back pain, and bilateral lower extremity swelling. Patient is s/p cesarean section on 08/11/17.  Patient is tolerating PO nutrition, estimates water consumption of in past 24 hours, most recent bowel movement yesterday. States she has to "run up and down stairs all day" to prep meals and care for her infant and is feeling exhausted.   All problems are new onset.  Headache Onset yesterday. Rated as 5-6/10. Bilateral "behind my eyes", does not radiate. Denies visual disturbances, dizzyness. Aggravated by lights and repositioning. Denies alleviating factors.  Fever and chills Onset last night. Patient reports temporal temperature of 102.2 and axillary temp of 100.4 overnight. Was managing at home by alternating Motrin and Tylenol but fever recurs. Took 1g Tylenol at 0530 this morning without relief of symptoms.  Low back pain Onset last night. Rated as 7/10, bilateral low back, does not radiate. No aggravating or alleviating factors.  Bilateral lower extremity swelling Noticed swelling yesterday. Reports swelling is significantly worse today. Denies calf pain, SOB, skin discoloration, unilateral swelling  OB History    Gravida  2   Para  2   Term  2   Preterm  0   AB  0   Living  2     SAB  0   TAB  0   Ectopic  0   Multiple  0   Live Births  2           Past Medical History:  Diagnosis Date  . Anemia   . Contact lens/glasses fitting   . Iron deficiency anemia 10/27/2011  . Normal labor 10/26/2011  . Postpartum care following cesarean delivery 10/28/2011  . PROM (premature rupture of membranes)  10/26/2011  . S/P cesarean section (arrest of dialation, 10/13) 10/28/2011  . SOB (shortness of breath)     Past Surgical History:  Procedure Laterality Date  . CESAREAN SECTION  10/27/2011   Procedure: CESAREAN SECTION;  Surgeon: Genia Del, MD;  Location: [facility];  Service: Obstetrics;  Laterality: N/A;  Primary cesarean section with delivery of baby boy at 76. Apgars  9/9.  Marland Kitchen CESAREAN SECTION N/A 08/11/2017   Procedure: CESAREAN SECTION;  Surgeon: Shea Evans, MD;  Location: [facility];  Service: Obstetrics;  Laterality: N/A;  . LAPAROSCOPIC GASTRIC BANDING  09/13/08  . OVARIAN CYST REMOVAL Left 2018    Family History  Problem Relation Age of Onset  . Heart disease Father        congestive heart failure  . Hypertension Father   . Arthritis Father   . Diabetes Father   . Gout Father     Social History   Tobacco Use  . Smoking status: Never Smoker  . Smokeless tobacco: Never Used  Substance Use Topics  . Alcohol use: No  . Drug use: No    Allergies: No Known Allergies  Medications Prior to Admission  Medication Sig Dispense Refill Last Dose  . ferrous sulfate 325 (65 FE) MG tablet Take 325 mg by mouth daily with breakfast.   Past Week at Unknown time  . ibuprofen (  ADVIL,MOTRIN) 800 MG tablet Take 1 tablet (800 mg total) by mouth every 6 (six) hours. 30 tablet 0 08/16/2017 at Unknown time  . magnesium oxide (MAG-OX) 400 (241.3 Mg) MG tablet Take 1 tablet (400 mg total) by mouth daily. 30 tablet 2 08/16/2017 at Unknown time  . pantoprazole (PROTONIX) 20 MG tablet Take 20 mg by mouth 2 (two) times daily.   08/16/2017 at Unknown time  . hydrochlorothiazide (MICROZIDE) 12.5 MG capsule Take 1 capsule (12.5 mg total) by mouth daily. 3 capsule 0   . oxyCODONE-acetaminophen (PERCOCET/ROXICET) 5-325 MG tablet Take 2 tablets by mouth every 6 (six) hours as needed (pain scale > 7). 30 tablet 0 Unknown at Unknown time  . Prenatal Vit-Fe Fumarate-FA (PRENATAL MULTIVITAMIN)  TABS tablet Take 1 tablet by mouth daily at 12 noon.   Unknown at Unknown time  . simethicone (MYLICON) 80 MG chewable tablet Chew 1 tablet (80 mg total) by mouth as needed for flatulence. 30 tablet 0 Unknown at Unknown time    Review of Systems  Constitutional: Positive for chills and fever.  Respiratory: Negative for shortness of breath.   Gastrointestinal: Negative for abdominal pain, constipation, nausea and vomiting.  Genitourinary: Negative for difficulty urinating.  Musculoskeletal: Positive for back pain.  Neurological: Positive for headaches.  All other systems reviewed and are negative.  Physical Exam   Blood pressure 129/70, pulse (!) 108, temperature (!) 100.6 F (38.1 C), temperature source Oral, resp. rate 18, height 5\' 1"  (1.549 m), weight 264 lb 12 oz (120.1 kg), SpO2 94 %, unknown if currently breastfeeding.  Physical Exam  Nursing note and vitals reviewed. Constitutional: She is oriented to person, place, and time. She appears well-developed and well-nourished.  Cardiovascular: Normal heart sounds and intact distal pulses.  Respiratory: Effort normal and breath sounds normal. No respiratory distress. She has no wheezes. She has no rales. She exhibits no tenderness. No breast swelling or discharge.  GI: Soft. Bowel sounds are normal. She exhibits no distension and no mass. There is no hepatosplenomegaly. There is no tenderness. There is no rebound, no guarding and no CVA tenderness. No hernia.  Neurological: She is alert and oriented to person, place, and time. She has normal reflexes.  Skin: Skin is warm and dry.  Psychiatric: She has a normal mood and affect. Her behavior is normal. Judgment and thought content normal.   Low Pfannenstiel incision behind honeycomb dressing. Dressing is dry and intact with small amount of old discharge  Mons pubis is slightly swollen, no ecchymosis noted.  Results for orders placed or performed during the hospital encounter of  08/17/17 (from the past 24 hour(s))  Urinalysis, Routine w reflex microscopic     Status: Abnormal   Collection Time: 08/17/17  7:52 AM  Result Value Ref Range   Color, Urine YELLOW YELLOW   APPearance HAZY (A) CLEAR   Specific Gravity, Urine 1.015 1.005 - 1.030   pH 8.0 5.0 - 8.0   Glucose, UA NEGATIVE NEGATIVE mg/dL   Hgb urine dipstick LARGE (A) NEGATIVE   Bilirubin Urine NEGATIVE NEGATIVE   Ketones, ur NEGATIVE NEGATIVE mg/dL   Protein, ur 30 (A) NEGATIVE mg/dL   Nitrite NEGATIVE NEGATIVE   Leukocytes, UA MODERATE (A) NEGATIVE   RBC / HPF >50 (H) 0 - 5 RBC/hpf   WBC, UA >50 (H) 0 - 5 WBC/hpf   Bacteria, UA NONE SEEN NONE SEEN   Squamous Epithelial / LPF 0-5 0 - 5   Mucus PRESENT    Non  Squamous Epithelial 0-5 (A) NONE SEEN  Lactic acid, plasma     Status: None   Collection Time: 08/17/17  8:27 AM  Result Value Ref Range   Lactic Acid, Venous 0.8 0.5 - 1.9 mmol/L  CBC with Differential/Platelet     Status: Abnormal   Collection Time: 08/17/17  8:38 AM  Result Value Ref Range   WBC 11.0 (H) 4.0 - 10.5 K/uL   RBC 3.19 (L) 3.87 - 5.11 MIL/uL   Hemoglobin 8.6 (L) 12.0 - 15.0 g/dL   HCT 95.626.0 (L) 21.336.0 - 08.646.0 %   MCV 81.5 78.0 - 100.0 fL   MCH 27.0 26.0 - 34.0 pg   MCHC 33.1 30.0 - 36.0 g/dL   RDW 57.817.5 (H) 46.911.5 - 62.915.5 %   Platelets 316 150 - 400 K/uL   Neutrophils Relative % 90 %   Neutro Abs 9.9 (H) 1.7 - 7.7 K/uL   Lymphocytes Relative 7 %   Lymphs Abs 0.8 0.7 - 4.0 K/uL   Monocytes Relative 2 %   Monocytes Absolute 0.2 0.1 - 1.0 K/uL   Eosinophils Relative 1 %   Eosinophils Absolute 0.1 0.0 - 0.7 K/uL   Basophils Relative 0 %   Basophils Absolute 0.0 0.0 - 0.1 K/uL  Comprehensive metabolic panel     Status: Abnormal   Collection Time: 08/17/17  8:38 AM  Result Value Ref Range   Sodium 135 135 - 145 mmol/L   Potassium 3.6 3.5 - 5.1 mmol/L   Chloride 101 98 - 111 mmol/L   CO2 21 (L) 22 - 32 mmol/L   Glucose, Bld 97 70 - 99 mg/dL   BUN 9 6 - 20 mg/dL   Creatinine,  Ser 5.280.72 0.44 - 1.00 mg/dL   Calcium 9.1 8.9 - 41.310.3 mg/dL   Total Protein 6.8 6.5 - 8.1 g/dL   Albumin 3.3 (L) 3.5 - 5.0 g/dL   AST 33 15 - 41 U/L   ALT 35 0 - 44 U/L   Alkaline Phosphatase 74 38 - 126 U/L   Total Bilirubin 0.5 0.3 - 1.2 mg/dL   GFR calc non Af Amer >60 >60 mL/min   GFR calc Af Amer >60 >60 mL/min   Anion gap 13 5 - 15  Urinalysis, Routine w reflex microscopic     Status: Abnormal   Collection Time: 08/17/17  9:38 AM  Result Value Ref Range   Color, Urine STRAW (A) YELLOW   APPearance CLEAR CLEAR   Specific Gravity, Urine 1.006 1.005 - 1.030   pH 8.0 5.0 - 8.0   Glucose, UA NEGATIVE NEGATIVE mg/dL   Hgb urine dipstick SMALL (A) NEGATIVE   Bilirubin Urine NEGATIVE NEGATIVE   Ketones, ur NEGATIVE NEGATIVE mg/dL   Protein, ur NEGATIVE NEGATIVE mg/dL   Nitrite NEGATIVE NEGATIVE   Leukocytes, UA NEGATIVE NEGATIVE   RBC / HPF 0-5 0 - 5 RBC/hpf   WBC, UA 0-5 0 - 5 WBC/hpf   Bacteria, UA RARE (A) NONE SEEN   Squamous Epithelial / LPF 0-5 0 - 5   Mucus PRESENT    Ct Abdomen Pelvis W Contrast  Result Date: 08/17/2017 CLINICAL DATA:  Fever of unknown origin. Abdominal and back pain. Leukocytosis. One week status post C-section delivery. EXAM: CT ABDOMEN AND PELVIS WITH CONTRAST TECHNIQUE: Multidetector CT imaging of the abdomen and pelvis was performed using the standard protocol following bolus administration of intravenous contrast. CONTRAST:  100mL ISOVUE-300 IOPAMIDOL (ISOVUE-300) INJECTION 61% COMPARISON:  03/27/2015 FINDINGS: Lower Chest: Patchy infiltrates  are seen in the left lower lobe and to lesser degree the medial right lower lobe, which are new since previous study. This may be due to pneumonia or pulmonary edema. No evidence of pleural effusion. Hepatobiliary: No hepatic masses identified. Mild hepatic steatosis. Gallbladder is unremarkable. Pancreas:  No mass or inflammatory changes. Spleen: Within normal limits in size and appearance. Adrenals/Urinary Tract:  Normal adrenal glands. Patchy areas of decreased parenchymal enhancement are seen in the upper and lower poles of the left kidney, suspicious for pyelonephritis. No evidence of renal mass or hydronephrosis. Unremarkable unopacified urinary bladder. Stomach/Bowel: Gastric lap band in appropriate position. No evidence of obstruction, inflammatory process or abnormal fluid collections. Vascular/Lymphatic: No pathologically enlarged lymph nodes. No abdominal aortic aneurysm. Reproductive: Enlarged postpartum uterus. Small amount of gas noted within lower portion of endometrial cavity. Endometritis cannot be excluded. No adnexal mass or free fluid identified. Other: Small amount low-attenuation fluid and high attenuation blood clot seen in suprapubic subcutaneous tissues at the C-section incision site. Musculoskeletal:  No suspicious bone lesions identified. IMPRESSION: Patchy areas of decreased parenchymal enhancement in the left kidney, highly suspicious for pyelonephritis. No evidence of ureteral calculi or hydronephrosis. Enlarged postpartum uterus. Tiny amount of air in the inferior portion of the endometrial cavity is nonspecific but may be seen with endometritis. No evidence of bladder flap hematoma. Small amount of fluid and blood clot within subcutaneous tissues of suprapubic anterior abdominal wall at site of recent C-section incision. Bilateral lower lobe infiltrates, left side greater than right. Mild hepatic steatosis. Electronically Signed   By: Myles Rosenthal M.D.   On: 08/17/2017 13:00     MAU Course  Procedures  MDM Oral temp re-checked on introduction to patient: 100.8 oral   Report given to V. Katrinka Blazing, CNM who assumes care of patient at this time  Clayton Bibles, [facility] 08/17/17  8:24 AM    Reviewed CBC, UA, lactic acid, exam and vital signs with Dr. Juliene Pina.  UA positive for RBCs and WBCs but question whether it could have been contaminated with lochia.  No other clear source of infection.  We  will repeat UA with cath specimen per Dr. Tildon Husky.  If negative, get CT to evaluate for septic pelvic thrombophlebitis.  Cath UA negative.  CT ordered.  CT consistent with left pyelonephritis.  No evidence of septic pelvic thrombophlebitis.  Discussed with Dr. Juliene Pina.  Will give IV Rocephin and MAU and sent home with Keflex.  Instructed patient to return to maternity admissions if she is unable to keep down oral antibiotics or as needed if symptoms worsen.  Follow-up in office in 1 week.  Assessment and Plan   1. Pyelonephritis   2. Postpartum examination following cesarean delivery   3. Postpartum fever    Plan Discharge home in stable condition per consult with Dr. Juliene Pina. Pyelonephritis precautions. Increase fluids and rest. Follow-up Information    [facility], [place] Follow up in 1 week(s).   Why:  call to schedule follow-up appointment  Contact information: [address] [place] [address] [phone]        [facility] [place] MATERNITY ADMISSIONS Follow up.   Why:  As needed if symptoms worsen or if unable to keep down medication due to nausea and vomiting. Contact information: [address] [id number] mc [place] [address] [phone]         Allergies as of 08/17/2017   No Known Allergies     Medication List    STOP taking these  medications   oxyCODONE-acetaminophen 5-325 MG tablet Commonly known as:  PERCOCET/ROXICET     TAKE these medications   cephALEXin 500 MG capsule Commonly known as:  KEFLEX Take 1 capsule (500 mg total) by mouth 4 (four) times daily for 14 days.   ferrous sulfate 325 (65 FE) MG tablet Take 325 mg by mouth daily with breakfast.   hydrochlorothiazide 12.5 MG capsule Commonly known as:  MICROZIDE Take 1 capsule (12.5 mg total) by mouth daily.   ibuprofen 800 MG tablet Commonly known as:  ADVIL,MOTRIN Take 1 tablet (800 mg total) by mouth every 8 (eight) hours as needed. What changed:     when to take this  reasons to take this   magnesium oxide 400 (241.3 Mg) MG tablet Commonly known as:  MAG-OX Take 1 tablet (400 mg total) by mouth daily.   ondansetron 4 MG tablet Commonly known as:  ZOFRAN Take 1 tablet (4 mg total) by mouth every 8 (eight) hours as needed for nausea or vomiting.   pantoprazole 20 MG tablet Commonly known as:  PROTONIX Take 20 mg by mouth 2 (two) times daily.   prenatal multivitamin Tabs tablet Take 1 tablet by mouth daily at 12 noon.   simethicone 80 MG chewable tablet Commonly known as:  MYLICON Chew 1 tablet (80 mg total) by mouth as needed for flatulence.   traMADol 50 MG tablet Commonly known as:  ULTRAM Take 1-2 tablets (50-100 mg total) by mouth every 6 (six) hours as needed for severe pain.       Katrinka Blazing, [place], CNM 08/17/2017 2:08 PM

## 2017-08-19 LAB — URINE CULTURE: SPECIAL REQUESTS: NORMAL

## 2017-08-22 LAB — CULTURE, BLOOD (ROUTINE X 2)
CULTURE: NO GROWTH
CULTURE: NO GROWTH
SPECIAL REQUESTS: ADEQUATE
Special Requests: ADEQUATE

## 2017-08-25 ENCOUNTER — Inpatient Hospital Stay (HOSPITAL_COMMUNITY): Payer: BLUE CROSS/BLUE SHIELD

## 2017-08-25 ENCOUNTER — Encounter (HOSPITAL_COMMUNITY): Payer: Self-pay

## 2017-08-25 ENCOUNTER — Other Ambulatory Visit: Payer: Self-pay

## 2017-08-25 ENCOUNTER — Inpatient Hospital Stay (HOSPITAL_COMMUNITY)
Admission: AD | Admit: 2017-08-25 | Discharge: 2017-08-25 | Disposition: A | Discharge status: Home / Self Care | Payer: BLUE CROSS/BLUE SHIELD | Source: Ambulatory Visit | Attending: Obstetrics and Gynecology | Admitting: Obstetrics and Gynecology

## 2017-08-25 DIAGNOSIS — Z79899 Other long term (current) drug therapy: Secondary | ICD-10-CM | POA: Insufficient documentation

## 2017-08-25 DIAGNOSIS — O9089 Other complications of the puerperium, not elsewhere classified: Secondary | ICD-10-CM | POA: Insufficient documentation

## 2017-08-25 DIAGNOSIS — R109 Unspecified abdominal pain: Secondary | ICD-10-CM | POA: Insufficient documentation

## 2017-08-25 DIAGNOSIS — Z79891 Long term (current) use of opiate analgesic: Secondary | ICD-10-CM | POA: Insufficient documentation

## 2017-08-25 LAB — COMPREHENSIVE METABOLIC PANEL
ALK PHOS: 75 U/L (ref 38–126)
ALT: 24 U/L (ref 0–44)
AST: 27 U/L (ref 15–41)
Albumin: 3.6 g/dL (ref 3.5–5.0)
Anion gap: 11 (ref 5–15)
BILIRUBIN TOTAL: 0.6 mg/dL (ref 0.3–1.2)
BUN: 12 mg/dL (ref 6–20)
CALCIUM: 9.1 mg/dL (ref 8.9–10.3)
CO2: 21 mmol/L — ABNORMAL LOW (ref 22–32)
CREATININE: 0.71 mg/dL (ref 0.44–1.00)
Chloride: 103 mmol/L (ref 98–111)
GFR calc Af Amer: >60 mL/min (ref >60)
Glucose, Bld: 105 mg/dL — ABNORMAL HIGH (ref 70–99)
Potassium: 4.1 mmol/L (ref 3.5–5.1)
Sodium: 135 mmol/L (ref 135–145)
TOTAL PROTEIN: 7.6 g/dL (ref 6.5–8.1)

## 2017-08-25 LAB — URINALYSIS, ROUTINE W REFLEX MICROSCOPIC
BACTERIA UA: NONE SEEN
Bilirubin Urine: NEGATIVE
Glucose, UA: NEGATIVE mg/dL
Ketones, ur: NEGATIVE mg/dL
Nitrite: NEGATIVE
PH: 7 (ref 5.0–8.0)
Protein, ur: NEGATIVE mg/dL
SPECIFIC GRAVITY, URINE: 1.02 (ref 1.005–1.030)

## 2017-08-25 LAB — CBC
HEMATOCRIT: 28.9 % — AB (ref 36.0–46.0)
Hemoglobin: 9.3 g/dL — ABNORMAL LOW (ref 12.0–15.0)
MCH: 25.9 pg — ABNORMAL LOW (ref 26.0–34.0)
MCHC: 32.2 g/dL (ref 30.0–36.0)
MCV: 80.5 fL (ref 78.0–100.0)
Platelets: 429 10*3/uL — ABNORMAL HIGH (ref 150–400)
RBC: 3.59 MIL/uL — AB (ref 3.87–5.11)
RDW: 17.7 % — ABNORMAL HIGH (ref 11.5–15.5)
WBC: 9.2 10*3/uL (ref 4.0–10.5)

## 2017-08-25 MED ORDER — LACTATED RINGERS IV BOLUS
1000.0000 mL | Freq: Once | INTRAVENOUS | Status: AC
  Administered 2017-08-25: 1000 mL via INTRAVENOUS

## 2017-08-25 MED ORDER — PROMETHAZINE HCL 25 MG/ML IJ SOLN
12.5000 mg | Freq: Once | INTRAMUSCULAR | Status: AC
  Administered 2017-08-25: 12.5 mg via INTRAVENOUS
  Filled 2017-08-25: qty 1

## 2017-08-25 MED ORDER — KETOROLAC TROMETHAMINE 30 MG/ML IJ SOLN
30.0000 mg | Freq: Once | INTRAMUSCULAR | Status: AC
  Administered 2017-08-25: 30 mg via INTRAVENOUS
  Filled 2017-08-25: qty 1

## 2017-08-25 NOTE — MAU Provider Note (Signed)
Chief Complaint: Flank Pain and Emesis   First Provider Initiated Contact with Patient 08/25/17 0323     SUBJECTIVE HPI: Victoria Knight is a 32 y.o. G2P2002 at 2 wks postpartum who presents to Maternity Admissions reporting n/v & flank pain. Was seen in MAU last week & diagnosed with pyelonephritis. Given rocephin in MAU & discharged home on keflex x 2 weeks which she is still taking. Symptoms had improved until yesterday evening when she reports left flank pain & n/v. Took ultram, ibuprofen & antiemetic without relief. Had episode of dysuria yesterday that has not continued. Denies fever/chills, urinary frequency, or hematuria. Has vomited 5+ times this evening. No diarrhea.    Location: left flank, radiates to LLQ Quality: sharp, throbbing Severity: 10/10 on pain scale Duration: 6 hrs Timing: constant Modifying factors: none. Not improved w/ultram & ibuprofen Associated signs and symptoms: n/v  Past Medical History:  Diagnosis Date  . Anemia   . Contact lens/glasses fitting   . Iron deficiency anemia 10/27/2011  . Normal labor 10/26/2011  . Postpartum care following cesarean delivery 10/28/2011  . PROM (premature rupture of membranes) 10/26/2011  . S/P cesarean section (arrest of dialation, 10/13) 10/28/2011  . SOB (shortness of breath)    OB History  Gravida Para Term Preterm AB Living  2 2 2  0 0 2  SAB TAB Ectopic Multiple Live Births  0 0 0 0 2    # Outcome Date GA Lbr Len/2nd Weight Sex Delivery Anes PTL Lv  2 Term 08/11/17 [id number]  3050 g M CS-LVertical EPI  LIV  1 Term 10/27/11 [id number]  2929 g M CS-LTranv EPI  LIV   Past Surgical History:  Procedure Laterality Date  . CESAREAN SECTION  10/27/2011   Procedure: CESAREAN SECTION;  Surgeon: Genia DelMarie-Lyne Lavoie, MD;  Location: [facility];  Service: Obstetrics;  Laterality: N/A;  Primary cesarean section with delivery of baby boy at [id number]. Apgars  9/9.  Marland Kitchen. CESAREAN SECTION N/A 08/11/2017   Procedure: CESAREAN SECTION;  Surgeon: Shea EvansMody,  Vaishali, MD;  [facility];  Service: Obstetrics;  Laterality: N/A;  . LAPAROSCOPIC GASTRIC BANDING  09/13/08  . OVARIAN CYST REMOVAL Left 2018   Social History   Socioeconomic History  . Marital status: Married    Spouse name: Not on file  . Number of children: Not on file  . Years of education: Not on file  . Highest education level: Not on file  Occupational History  . Not on file  Social Needs  . Financial resource strain: Not on file  . Food insecurity:    Worry: Not on file    Inability: Not on file  . Transportation needs:    Medical: Not on file    Non-medical: Not on file  Tobacco Use  . Smoking status: Never Smoker  . Smokeless tobacco: Never Used  Substance and Sexual Activity  . Alcohol use: No  . Drug use: No  . Sexual activity: Not Currently    Birth control/protection: None  Lifestyle  . Physical activity:    Days per week: Not on file    Minutes per session: Not on file  . Stress: Not on file  Relationships  . Social connections:    Talks on phone: Not on file    Gets together: Not on file    Attends religious service: Not on file    Active member of club or organization: Not on file    Attends meetings of clubs or organizations: Not  on file    Relationship status: Not on file  . Intimate partner violence:    Fear of current or ex partner: Not on file    Emotionally abused: Not on file    Physically abused: Not on file    Forced sexual activity: Not on file  Other Topics Concern  . Not on file  Social History Narrative  . Not on file   Family History  Problem Relation Age of Onset  . Heart disease Father        congestive heart failure  . Hypertension Father   . Arthritis Father   . Diabetes Father   . Gout Father    No current facility-administered medications on file prior to encounter.    Current Outpatient Medications on File Prior to Encounter  Medication Sig Dispense Refill  . cephALEXin (KEFLEX) 500 MG capsule  Take 1 capsule (500 mg total) by mouth 4 (four) times daily for 14 days. 56 capsule 0  . ferrous sulfate 325 (65 FE) MG tablet Take 325 mg by mouth daily with breakfast.    . ibuprofen (ADVIL,MOTRIN) 800 MG tablet Take 1 tablet (800 mg total) by mouth every 8 (eight) hours as needed. 30 tablet 0  . ondansetron (ZOFRAN) 4 MG tablet Take 1 tablet (4 mg total) by mouth every 8 (eight) hours as needed for nausea or vomiting. 30 tablet 0  . Prenatal Vit-Fe Fumarate-FA (PRENATAL MULTIVITAMIN) TABS tablet Take 1 tablet by mouth daily at 12 noon.    . traMADol (ULTRAM) 50 MG tablet Take 1-2 tablets (50-100 mg total) by mouth every 6 (six) hours as needed for severe pain. 30 tablet 0  . hydrochlorothiazide (MICROZIDE) 12.5 MG capsule Take 1 capsule (12.5 mg total) by mouth daily. 3 capsule 0  . magnesium oxide (MAG-OX) 400 (241.3 Mg) MG tablet Take 1 tablet (400 mg total) by mouth daily. 30 tablet 2  . pantoprazole (PROTONIX) 20 MG tablet Take 20 mg by mouth 2 (two) times daily.    . simethicone (MYLICON) 80 MG chewable tablet Chew 1 tablet (80 mg total) by mouth as needed for flatulence. 30 tablet 0   No Known Allergies  I have reviewed patient's Past Medical Hx, Surgical Hx, Family Hx, Social Hx, medications and allergies.   Review of Systems  Constitutional: Negative.   Gastrointestinal: Positive for abdominal pain, nausea and vomiting.  Genitourinary: Positive for flank pain. Negative for frequency and hematuria.    OBJECTIVE Patient Vitals for the past 24 hrs:  BP Temp Temp src Pulse Resp SpO2 Weight  08/25/17 0634 103/63 - - - - - -  08/25/17 0529 (!) 114/56 98.8 F (37.1 C) Oral 65 18 - -  08/25/17 0234 139/78 97.9 F (36.6 C) Oral 90 (!) 40 100 % 115.2 kg   Constitutional: Well-developed, well-nourished female. Pt writhing & moaning in bed  Cardiovascular: normal rate & rhythm, no murmur Respiratory: normal rate and effort. Lung sounds clear throughout GI: Abd soft, non-tender, Pos BS  x 4. No guarding or rebound tenderness. No CMT MS: Extremities nontender, no edema, normal ROM Neurologic: Alert and oriented x 4.      LAB RESULTS Results for orders placed or performed during the hospital encounter of 08/25/17 (from the past 24 hour(s))  Urinalysis, Routine w reflex microscopic     Status: Abnormal   Collection Time: 08/25/17  3:01 AM  Result Value Ref Range   Color, Urine YELLOW YELLOW   APPearance CLEAR CLEAR   Specific  Gravity, Urine 1.020 1.005 - 1.030   pH 7.0 5.0 - 8.0   Glucose, UA NEGATIVE NEGATIVE mg/dL   Hgb urine dipstick MODERATE (A) NEGATIVE   Bilirubin Urine NEGATIVE NEGATIVE   Ketones, ur NEGATIVE NEGATIVE mg/dL   Protein, ur NEGATIVE NEGATIVE mg/dL   Nitrite NEGATIVE NEGATIVE   Leukocytes, UA MODERATE (A) NEGATIVE   RBC / HPF 21-50 0 - 5 RBC/hpf   WBC, UA 11-20 0 - 5 WBC/hpf   Bacteria, UA NONE SEEN NONE SEEN   Squamous Epithelial / LPF 0-5 0 - 5   Mucus PRESENT   CBC     Status: Abnormal   Collection Time: 08/25/17  3:52 AM  Result Value Ref Range   WBC 9.2 4.0 - 10.5 K/uL   RBC 3.59 (L) 3.87 - 5.11 MIL/uL   Hemoglobin 9.3 (L) 12.0 - 15.0 g/dL   HCT 40.9 (L) 81.1 - 91.4 %   MCV 80.5 78.0 - 100.0 fL   MCH 25.9 (L) 26.0 - 34.0 pg   MCHC 32.2 30.0 - 36.0 g/dL   RDW 78.2 (H) 95.6 - 21.3 %   Platelets 429 (H) 150 - 400 K/uL  Comprehensive metabolic panel     Status: Abnormal   Collection Time: 08/25/17  3:52 AM  Result Value Ref Range   Sodium 135 135 - 145 mmol/L   Potassium 4.1 3.5 - 5.1 mmol/L   Chloride 103 98 - 111 mmol/L   CO2 21 (L) 22 - 32 mmol/L   Glucose, Bld 105 (H) 70 - 99 mg/dL   BUN 12 6 - 20 mg/dL   Creatinine, Ser 0.86 0.44 - 1.00 mg/dL   Calcium 9.1 8.9 - 57.8 mg/dL   Total Protein 7.6 6.5 - 8.1 g/dL   Albumin 3.6 3.5 - 5.0 g/dL   AST 27 15 - 41 U/L   ALT 24 0 - 44 U/L   Alkaline Phosphatase 75 38 - 126 U/L   Total Bilirubin 0.6 0.3 - 1.2 mg/dL   GFR calc non Af Amer >60 >60 mL/min   GFR calc Af Amer >60 >60  mL/min   Anion gap 11 5 - 15    IMAGING US Renal  Result Date: 08/25/2017 CLINICAL DATA:  32 y/o  F; left flank pain. EXAM: RENAL / URINARY TRACT ULTRASOUND COMPLETE COMPARISON:  08/17/2017 CT abdomen and pelvis. FINDINGS: Right Kidney: Length: 11.1 cm. Echogenicity within normal limits. No mass or hydronephrosis visualized. Left Kidney: Length: 10.6 cm. Echogenicity within normal limits. No mass or hydronephrosis visualized. Bladder: Appears normal for degree of bladder distention. Bilateral ureteral jets noted. IMPRESSION: Normal renal ultrasound. Electronically Signed   By: Mitzi Hansen M.D.   On: 08/25/2017 05:07    MAU COURSE Orders Placed This Encounter  Procedures  . Urine Culture  . US RENAL  . Urinalysis, Routine w reflex microscopic  . CBC  . Comprehensive metabolic panel  . Discharge patient   Meds ordered this encounter  Medications  . lactated ringers bolus 1,000 mL  . ketorolac (TORADOL) 30 MG/ML injection 30 mg  . promethazine (PHENERGAN) injection 12.5 mg    MDM Afebrile & no leukocytosis on CBC Normal renal ultrasound IV fluids, toradol, & phenergan given with moderate relief of symptoms Moderate hgb in u/a C/w Dr. Amado Nash. Symptoms present more like kidney stone than infection. Pt to continue abx until completed, increase fluid intake, and schedule f/u with Dr. Juliene Pina later this week.  Patient agreeable with plan. States she doesn't need any  refills for pain medication or nausea meds.   ASSESSMENT 1. Left flank pain     PLAN Discharge home in stable condition.  Follow-up Information    [facility], [facility] Follow up.   Why:  Call office for follow up appointment later this week Contact information: [address] [place] [id number] [phone]          Allergies as of 08/25/2017   No Known Allergies     Medication List    TAKE these medications   cephALEXin 500 MG capsule Commonly known as:  KEFLEX Take 1 capsule (500 mg  total) by mouth 4 (four) times daily for 14 days.   ferrous sulfate 325 (65 FE) MG tablet Take 325 mg by mouth daily with breakfast.   hydrochlorothiazide 12.5 MG capsule Commonly known as:  MICROZIDE Take 1 capsule (12.5 mg total) by mouth daily.   ibuprofen 800 MG tablet Commonly known as:  ADVIL,MOTRIN Take 1 tablet (800 mg total) by mouth every 8 (eight) hours as needed.   magnesium oxide 400 (241.3 Mg) MG tablet Commonly known as:  MAG-OX Take 1 tablet (400 mg total) by mouth daily.   ondansetron 4 MG tablet Commonly known as:  ZOFRAN Take 1 tablet (4 mg total) by mouth every 8 (eight) hours as needed for nausea or vomiting.   pantoprazole 20 MG tablet Commonly known as:  PROTONIX Take 20 mg by mouth 2 (two) times daily.   prenatal multivitamin Tabs tablet Take 1 tablet by mouth daily at 12 noon.   simethicone 80 MG chewable tablet Commonly known as:  MYLICON Chew 1 tablet (80 mg total) by mouth as needed for flatulence.   traMADol 50 MG tablet Commonly known as:  ULTRAM Take 1-2 tablets (50-100 mg total) by mouth every 6 (six) hours as needed for severe pain.        Judeth HornLawrence, Marietta Sikkema, NP 08/25/2017  9:53 AM

## 2017-08-25 NOTE — MAU Note (Signed)
Pt delivered via c/s 2 weeks ago. Pt reports pain in her L lower back that started today and has become worse. Motrin at 1900. Tramadol @ 2000 and 0100 with no relief. Dysuria started yesterday.

## 2017-08-25 NOTE — Discharge Instructions (Signed)
Flank Pain, Adult Flank pain is pain that is located on the side of the body between the upper abdomen and the back. This area is called the flank. The pain may occur over a short period of time (acute), or it may be long-term or recurring (chronic). It may be mild or severe. Flank pain can be caused by many things, including:  Muscle soreness or injury.  Kidney stones or kidney disease.  Stress.  A disease of the spine (vertebral disk disease).  A lung infection (pneumonia).  Fluid around the lungs (pulmonary edema).  A skin rash caused by the chickenpox virus (shingles).  Tumors that affect the back of the abdomen.  Gallbladder disease.  Follow these instructions at home:  Drink enough fluid to keep your urine clear or pale yellow.  Rest as told by your health care provider.  Take over-the-counter and prescription medicines only as told by your health care provider.  Keep a journal to track what has caused your flank pain and what has made it feel better.  Keep all follow-up visits as told by your health care provider. This is important. Contact a health care provider if:  Your pain is not controlled with medicine.  You have new symptoms.  Your pain gets worse.  You have a fever.  Your symptoms last longer than 2-3 days.  You have trouble urinating or you are urinating very frequently. Get help right away if:  You have trouble breathing or you are short of breath.  Your abdomen hurts or it is swollen or red.  You have nausea or vomiting.  You feel faint or you pass out.  You have blood in your urine. Summary  Flank pain is pain that is located on the side of the body between the upper abdomen and the back.  The pain may occur over a short period of time (acute), or it may be long-term or recurring (chronic). It may be mild or severe.  Flank pain can be caused by many things.  Contact your health care provider if your symptoms get worse or they last  longer than 2-3 days. This information is not intended to replace advice given to you by your health care provider. Make sure you discuss any questions you have with your health care provider. Document Released: 02/21/2005 Document Revised: 03/15/2016 Document Reviewed: 03/15/2016 Elsevier Interactive Patient Education  2018 [employer]  

## 2017-08-26 LAB — URINE CULTURE: CULTURE: NO GROWTH

## 2017-12-04 ENCOUNTER — Inpatient Hospital Stay (EMERGENCY_DEPARTMENT_HOSPITAL)
Admission: AD | Admit: 2017-12-04 | Discharge: 2017-12-05 | Disposition: A | Discharge status: Home / Self Care | Payer: BLUE CROSS/BLUE SHIELD | Source: Ambulatory Visit | Attending: Obstetrics & Gynecology | Admitting: Obstetrics & Gynecology

## 2017-12-04 ENCOUNTER — Other Ambulatory Visit: Payer: Self-pay

## 2017-12-04 ENCOUNTER — Encounter (HOSPITAL_COMMUNITY): Payer: Self-pay | Admitting: *Deleted

## 2017-12-04 DIAGNOSIS — R1010 Upper abdominal pain, unspecified: Secondary | ICD-10-CM

## 2017-12-04 DIAGNOSIS — R748 Abnormal levels of other serum enzymes: Secondary | ICD-10-CM | POA: Diagnosis not present

## 2017-12-04 DIAGNOSIS — Z791 Long term (current) use of non-steroidal anti-inflammatories (NSAID): Secondary | ICD-10-CM | POA: Insufficient documentation

## 2017-12-04 DIAGNOSIS — K8000 Calculus of gallbladder with acute cholecystitis without obstruction: Secondary | ICD-10-CM | POA: Diagnosis not present

## 2017-12-04 DIAGNOSIS — Z79899 Other long term (current) drug therapy: Secondary | ICD-10-CM | POA: Insufficient documentation

## 2017-12-04 DIAGNOSIS — R948 Abnormal results of function studies of other organs and systems: Secondary | ICD-10-CM | POA: Insufficient documentation

## 2017-12-04 DIAGNOSIS — K8020 Calculus of gallbladder without cholecystitis without obstruction: Secondary | ICD-10-CM

## 2017-12-04 DIAGNOSIS — R1011 Right upper quadrant pain: Secondary | ICD-10-CM | POA: Diagnosis not present

## 2017-12-04 LAB — URINALYSIS, ROUTINE W REFLEX MICROSCOPIC
BILIRUBIN URINE: NEGATIVE
Bacteria, UA: NONE SEEN
Bilirubin Urine: NEGATIVE
GLUCOSE, UA: NEGATIVE mg/dL
Glucose, UA: NEGATIVE mg/dL
Hgb urine dipstick: NEGATIVE
Hgb urine dipstick: NEGATIVE
KETONES UR: NEGATIVE mg/dL
Ketones, ur: NEGATIVE mg/dL
LEUKOCYTES UA: NEGATIVE
Nitrite: NEGATIVE
Nitrite: NEGATIVE
PH: 5 (ref 5.0–8.0)
PROTEIN: NEGATIVE mg/dL
PROTEIN: NEGATIVE mg/dL
SPECIFIC GRAVITY, URINE: 1.005 (ref 1.005–1.030)
Specific Gravity, Urine: 1.031 — ABNORMAL HIGH (ref 1.005–1.030)
pH: 7 (ref 5.0–8.0)

## 2017-12-04 LAB — POCT PREGNANCY, URINE
PREG TEST UR: POSITIVE — AB
Preg Test, Ur: NEGATIVE

## 2017-12-04 LAB — CBC
HEMATOCRIT: 33 % — AB (ref 36.0–46.0)
Hemoglobin: 10 g/dL — ABNORMAL LOW (ref 12.0–15.0)
MCH: 22 pg — ABNORMAL LOW (ref 26.0–34.0)
MCHC: 30.3 g/dL (ref 30.0–36.0)
MCV: 72.7 fL — AB (ref 80.0–100.0)
Platelets: 322 10*3/uL (ref 150–400)
RBC: 4.54 MIL/uL (ref 3.87–5.11)
RDW: 19.7 % — AB (ref 11.5–15.5)
WBC: 11.4 10*3/uL — ABNORMAL HIGH (ref 4.0–10.5)
nRBC: 0 % (ref 0.0–0.2)

## 2017-12-04 MED ORDER — PROMETHAZINE HCL 25 MG/ML IJ SOLN
12.5000 mg | Freq: Once | INTRAMUSCULAR | Status: AC
  Administered 2017-12-05: 12.5 mg via INTRAVENOUS
  Filled 2017-12-04: qty 1

## 2017-12-04 MED ORDER — HYDROMORPHONE HCL 1 MG/ML IJ SOLN
1.0000 mg | Freq: Once | INTRAMUSCULAR | Status: DC
  Filled 2017-12-04: qty 1

## 2017-12-04 NOTE — MAU Provider Note (Signed)
Chief Complaint:  Abdominal Pain   First Provider Initiated Contact with Patient 12/04/17 2312      HPI: Victoria Knight is a 32 y.o. [id number] who presents to maternity admissions reporting upper abdominal pain which is severe.  Is quite uncomfortable, lying prone on bed.   Just started this afternoon.  NO pain in lower abdomen.  Also has some nausea and vomiting.  No bleeding.  States has not had intercourse since delivery of her baby.  No pain with C/S scar. .She reports no vaginal bleeding, vaginal itching/burning, urinary symptoms, h/a, dizziness, or fever/chills.    Abdominal Pain  This is a new problem. The current episode started today. The onset quality is sudden. The problem occurs constantly. The problem has been unchanged. The pain is located in the epigastric region. The pain is at a severity of 10/10. The pain is severe. The quality of the pain is sharp. The abdominal pain does not radiate. Associated symptoms include nausea and vomiting. Pertinent negatives include no anorexia, constipation, diarrhea, dysuria, fever, frequency, headaches or myalgias. The pain is aggravated by certain positions. The pain is relieved by nothing. She has tried nothing for the symptoms.    RN note: Pt reports mid abd pain that stared this afternoon. Having N/V also. Took some ibuprofen without relief.  Past Medical History: Past Medical History:  Diagnosis Date  . Anemia   . Contact lens/glasses fitting   . Iron deficiency anemia 10/27/2011  . Normal labor 10/26/2011  . Postpartum care following cesarean delivery 10/28/2011  . PROM (premature rupture of membranes) 10/26/2011  . S/P cesarean section (arrest of dialation, 10/13) 10/28/2011  . SOB (shortness of breath)     Past obstetric history: OB History  Gravida Para Term Preterm AB Living  2 2 2  0 0 2  SAB TAB Ectopic Multiple Live Births  0 0 0 0 2    # Outcome Date GA Lbr Len/2nd Weight Sex Delivery Anes PTL Lv  2 Term 08/11/17 [redacted]w[redacted]d   3050 g M CS-LVertical EPI  LIV  1 Term 10/27/11 [redacted]w[redacted]d  2929 g M CS-LTranv EPI  LIV    Past Surgical History: Past Surgical History:  Procedure Laterality Date  . CESAREAN SECTION  10/27/2011   Procedure: CESAREAN SECTION;  Surgeon: Genia Del, MD;  Location: [facility];  Service: Obstetrics;  Laterality: N/A;  Primary cesarean section with delivery of baby boy at 30. Apgars  9/9.  Marland Kitchen CESAREAN SECTION N/A 08/11/2017   Procedure: CESAREAN SECTION;  Surgeon: Shea Evans, MD;  Location: [facility] BIRTHING SUITES;  Service: Obstetrics;  Laterality: N/A;  . LAPAROSCOPIC GASTRIC BANDING  09/13/08  . OVARIAN CYST REMOVAL Left 2018    Family History: Family History  Problem Relation Age of Onset  . Heart disease Father        congestive heart failure  . Hypertension Father   . Arthritis Father   . Diabetes Father   . Gout Father     Social History: Social History   Tobacco Use  . Smoking status: Never Smoker  . Smokeless tobacco: Never Used  Substance Use Topics  . Alcohol use: No  . Drug use: No    Allergies: No Known Allergies  Meds:  Medications Prior to Admission  Medication Sig Dispense Refill Last Dose  . ferrous sulfate 325 (65 FE) MG tablet Take 325 mg by mouth daily with breakfast.   08/24/2017 at Unknown time  . hydrochlorothiazide (MICROZIDE) 12.5 MG capsule Take 1 capsule (12.5  mg total) by mouth daily. 3 capsule 0 Unknown at Unknown time  . ibuprofen (ADVIL,MOTRIN) 800 MG tablet Take 1 tablet (800 mg total) by mouth every 8 (eight) hours as needed. 30 tablet 0 08/24/2017 at Unknown time  . magnesium oxide (MAG-OX) 400 (241.3 Mg) MG tablet Take 1 tablet (400 mg total) by mouth daily. 30 tablet 2 Unknown at Unknown time  . ondansetron (ZOFRAN) 4 MG tablet Take 1 tablet (4 mg total) by mouth every 8 (eight) hours as needed for nausea or vomiting. 30 tablet 0 08/24/2017 at Unknown time  . pantoprazole (PROTONIX) 20 MG tablet Take 20 mg by mouth 2 (two) times daily.   Unknown  at Unknown time  . Prenatal Vit-Fe Fumarate-FA (PRENATAL MULTIVITAMIN) TABS tablet Take 1 tablet by mouth daily at 12 noon.   08/24/2017 at Unknown time  . simethicone (MYLICON) 80 MG chewable tablet Chew 1 tablet (80 mg total) by mouth as needed for flatulence. 30 tablet 0 Unknown at Unknown time  . traMADol (ULTRAM) 50 MG tablet Take 1-2 tablets (50-100 mg total) by mouth every 6 (six) hours as needed for severe pain. 30 tablet 0 08/24/2017 at Unknown time    I have reviewed patient's Past Medical Hx, Surgical Hx, Family Hx, Social Hx, medications and allergies.  ROS:  Review of Systems  Constitutional: Negative for fever.  Gastrointestinal: Positive for abdominal pain, nausea and vomiting. Negative for anorexia, constipation and diarrhea.  Genitourinary: Negative for dysuria and frequency.  Musculoskeletal: Negative for myalgias.  Neurological: Negative for headaches.   Other systems negative    Physical Exam   Patient Vitals for the past 24 hrs:  BP Temp Pulse Resp Height Weight  12/04/17 2241 (!) 143/85 98.7 F (37.1 C) (!) 105 18 5\' 1"  (1.549 m) 118.8 kg   Constitutional: Well-developed, well-nourished female in acute pain, writhing, lying on stomach on bed.  Points to and rubbing upper abdomen. Cardiovascular: normal rate and rhythm, no ectopy audible, S1 & S2 heard, no murmur Respiratory: normal effort, no distress. Lungs CTAB with no wheezes or crackles GI: Abd soft, tender over upper abdomen.  Mildly distended.  No rebound, No guarding.  Bowel Sounds audible  MS: Extremities nontender, no edema, normal ROM Neurologic: Alert and oriented x 4.   Grossly nonfocal. GU: Neg CVAT. Skin:  Warm and Dry Psych:  Affect appropriate.  PELVIC EXAM: deferred    Labs:  (positive UPT below is incorrect, mislabeled, there is also an extra UA that was mislabeled)  --/--/O POS (07/28 1336) Results for orders placed or performed during the hospital encounter of 12/04/17 (from the past  24 hour(s))  Pregnancy, urine POC     Status: Abnormal   Collection Time: 12/04/17 10:29 PM  Result Value Ref Range   Preg Test, Ur POSITIVE (A) NEGATIVE  Urinalysis, Routine w reflex microscopic     Status: Abnormal   Collection Time: 12/04/17 10:32 PM  Result Value Ref Range   Color, Urine STRAW (A) YELLOW   APPearance CLEAR CLEAR   Specific Gravity, Urine 1.005 1.005 - 1.030   pH 7.0 5.0 - 8.0   Glucose, UA NEGATIVE NEGATIVE mg/dL   Hgb urine dipstick NEGATIVE NEGATIVE   Bilirubin Urine NEGATIVE NEGATIVE   Ketones, ur NEGATIVE NEGATIVE mg/dL   Protein, ur NEGATIVE NEGATIVE mg/dL   Nitrite NEGATIVE NEGATIVE   Leukocytes, UA NEGATIVE NEGATIVE  Urinalysis, Routine w reflex microscopic     Status: Abnormal   Collection Time: 12/04/17 10:59 PM  Result Value Ref Range   Color, Urine YELLOW YELLOW   APPearance HAZY (A) CLEAR   Specific Gravity, Urine 1.031 (H) 1.005 - 1.030   pH 5.0 5.0 - 8.0   Glucose, UA NEGATIVE NEGATIVE mg/dL   Hgb urine dipstick NEGATIVE NEGATIVE   Bilirubin Urine NEGATIVE NEGATIVE   Ketones, ur NEGATIVE NEGATIVE mg/dL   Protein, ur NEGATIVE NEGATIVE mg/dL   Nitrite NEGATIVE NEGATIVE   Leukocytes, UA SMALL (A) NEGATIVE   RBC / HPF 0-5 0 - 5 RBC/hpf   WBC, UA 6-10 0 - 5 WBC/hpf   Bacteria, UA NONE SEEN NONE SEEN   Squamous Epithelial / LPF 0-5 0 - 5   Mucus PRESENT   Pregnancy, urine POC     Status: None   Collection Time: 12/04/17 11:02 PM  Result Value Ref Range   Preg Test, Ur NEGATIVE NEGATIVE  hCG, quantitative, pregnancy     Status: None   Collection Time: 12/04/17 11:15 PM  Result Value Ref Range   hCG, Beta Chain, Quant, S <1 <5 mIU/mL  CBC     Status: Abnormal   Collection Time: 12/04/17 11:15 PM  Result Value Ref Range   WBC 11.4 (H) 4.0 - 10.5 K/uL   RBC 4.54 3.87 - 5.11 MIL/uL   Hemoglobin 10.0 (L) 12.0 - 15.0 g/dL   HCT 95.2 (L) 84.1 - 32.4 %   MCV 72.7 (L) 80.0 - 100.0 fL   MCH 22.0 (L) 26.0 - 34.0 pg   MCHC 30.3 30.0 - 36.0 g/dL    RDW 40.1 (H) 02.7 - 15.5 %   Platelets 322 150 - 400 K/uL   nRBC 0.0 0.0 - 0.2 %  Type and screen     Status: None   Collection Time: 12/04/17 11:15 PM  Result Value Ref Range   ABO/RH(D) O POS    Antibody Screen NEG    Sample Expiration      12/07/2017 Performed at [facility], [address], [place], [place] [address]   Differential     Status: Abnormal   Collection Time: 12/04/17 11:15 PM  Result Value Ref Range   Neutrophils Relative % 86 %   Neutro Abs 9.5 (H) 1.7 - 7.7 K/uL   Lymphocytes Relative 12 %   Lymphs Abs 1.3 0.7 - 4.0 K/uL   Monocytes Relative 2 %   Monocytes Absolute 0.2 0.1 - 1.0 K/uL   Eosinophils Relative 0 %   Eosinophils Absolute 0.0 0.0 - 0.5 K/uL   Basophils Relative 0 %   Basophils Absolute 0.0 0.0 - 0.1 K/uL  Comprehensive metabolic panel     Status: Abnormal   Collection Time: 12/04/17 11:15 PM  Result Value Ref Range   Sodium 139 135 - 145 mmol/L   Potassium 3.8 3.5 - 5.1 mmol/L   Chloride 106 98 - 111 mmol/L   CO2 24 22 - 32 mmol/L   Glucose, Bld 117 (H) 70 - 99 mg/dL   BUN 11 6 - 20 mg/dL   Creatinine, Ser 4.40 0.44 - 1.00 mg/dL   Calcium 9.2 8.9 - 34.7 mg/dL   Total Protein 7.9 6.5 - 8.1 g/dL   Albumin 4.2 3.5 - 5.0 g/dL   AST 25 15 - 41 U/L   ALT 28 0 - 44 U/L   Alkaline Phosphatase 89 38 - 126 U/L   Total Bilirubin 0.4 0.3 - 1.2 mg/dL   GFR calc non Af Amer >60 >60 mL/min   GFR calc Af Amer >60 >60  mL/min   Anion gap 9 5 - 15  Lipase, blood     Status: None   Collection Time: 12/04/17 11:15 PM  Result Value Ref Range   Lipase 36 11 - 51 U/L  Amylase     Status: Abnormal   Collection Time: 12/04/17 11:15 PM  Result Value Ref Range   Amylase 161 (H) 28 - 100 U/L    Imaging:  US Abdomen Complete  Result Date: 12/05/2017 CLINICAL DATA:  Upper abdominal pain. EXAM: ABDOMEN ULTRASOUND COMPLETE COMPARISON:  CT scan August 17, 2017 FINDINGS: Gallbladder: Cholelithiasis without wall thickening or pericholecystic fluid. A  Murphy's sign cannot be well assessed as the patient was given pain medications. Common bile duct: Diameter: 4.7 mm Liver: No focal mass. Portal vein is patent on color Doppler imaging with normal direction of blood flow towards the liver. IVC: No abnormality visualized. Pancreas: Visualized portion unremarkable. Spleen: Size and appearance within normal limits. Right Kidney: Length: 11.4 cm. Echogenicity within normal limits. No mass or hydronephrosis visualized. Left Kidney: Length: 11.0 cm. Echogenicity within normal limits. No mass or hydronephrosis visualized. Abdominal aorta: No aneurysm visualized. Other findings: None. IMPRESSION: 1. Cholelithiasis without wall thickening or pericholecystic fluid. A Murphy's sign cannot be assessed as the patient was given pain medications. If there is continued clinical concern for acute cholecystitis, recommend a HIDA scan. 2. No other significant abnormalities. Electronically Signed   By: Gerome Sam III M.D   On: 12/05/2017 01:07    MAU Course/MDM: I have ordered labs as follows: See above. There is mild leukocytosis and elevation in her amylase.  ?pancreatitis Imaging ordered: Abdominal Ultrasound showed cholelithiasis without overt cholecystitis Results reviewed.   Consult Dr Jolayne Panther who recommends curbside with Surgeon.   I spoke with Dr Carolynne Edouard, who recommends followup in their office for consultation.  Message left with [facility].   Treatments in MAU included analgesic, IV fluids.   Pt stable at time of discharge.  Assessment: 1. Upper abdominal pain   2.     Cholelithiasis 3.     Elevated amylase, though not triple the norm, question pancreatitis, though does not have a lot of vomiting  Plan: Discharge home Recommend Followup with [facility] this week Rx sent for Percocet  for acute pain (limited Rx) Rx Zofran for nausea  Encouraged to return here or to other Urgent Care/ED if she develops worsening of symptoms,  increase in pain, fever, or other concerning symptoms.   Wynelle Bourgeois CNM, MSN Certified Nurse-Midwife 12/04/2017 11:50 PM

## 2017-12-04 NOTE — MAU Note (Signed)
Pt reports mid abd pain that stared this afternoon. Having N/V also. Took some ibuprofen without relief.

## 2017-12-05 ENCOUNTER — Encounter (HOSPITAL_COMMUNITY): Payer: Self-pay | Admitting: Emergency Medicine

## 2017-12-05 ENCOUNTER — Inpatient Hospital Stay (HOSPITAL_COMMUNITY)
Admission: EM | Admit: 2017-12-05 | Discharge: 2017-12-07 | DRG: 419 | Disposition: A | Discharge status: Home / Self Care | Payer: BLUE CROSS/BLUE SHIELD | Attending: Surgery | Admitting: Surgery

## 2017-12-05 ENCOUNTER — Emergency Department (HOSPITAL_COMMUNITY): Payer: BLUE CROSS/BLUE SHIELD

## 2017-12-05 ENCOUNTER — Inpatient Hospital Stay (HOSPITAL_COMMUNITY): Payer: BLUE CROSS/BLUE SHIELD

## 2017-12-05 DIAGNOSIS — Z79891 Long term (current) use of opiate analgesic: Secondary | ICD-10-CM | POA: Diagnosis not present

## 2017-12-05 DIAGNOSIS — Z8249 Family history of ischemic heart disease and other diseases of the circulatory system: Secondary | ICD-10-CM | POA: Diagnosis not present

## 2017-12-05 DIAGNOSIS — Z79899 Other long term (current) drug therapy: Secondary | ICD-10-CM

## 2017-12-05 DIAGNOSIS — K8000 Calculus of gallbladder with acute cholecystitis without obstruction: Secondary | ICD-10-CM | POA: Diagnosis present

## 2017-12-05 DIAGNOSIS — F419 Anxiety disorder, unspecified: Secondary | ICD-10-CM | POA: Diagnosis present

## 2017-12-05 DIAGNOSIS — D509 Iron deficiency anemia, unspecified: Secondary | ICD-10-CM | POA: Diagnosis present

## 2017-12-05 DIAGNOSIS — R1011 Right upper quadrant pain: Secondary | ICD-10-CM | POA: Diagnosis present

## 2017-12-05 DIAGNOSIS — Z9884 Bariatric surgery status: Secondary | ICD-10-CM

## 2017-12-05 DIAGNOSIS — I10 Essential (primary) hypertension: Secondary | ICD-10-CM | POA: Diagnosis present

## 2017-12-05 DIAGNOSIS — K819 Cholecystitis, unspecified: Secondary | ICD-10-CM | POA: Diagnosis present

## 2017-12-05 DIAGNOSIS — F319 Bipolar disorder, unspecified: Secondary | ICD-10-CM | POA: Diagnosis present

## 2017-12-05 DIAGNOSIS — Z791 Long term (current) use of non-steroidal anti-inflammatories (NSAID): Secondary | ICD-10-CM | POA: Diagnosis not present

## 2017-12-05 DIAGNOSIS — Z833 Family history of diabetes mellitus: Secondary | ICD-10-CM

## 2017-12-05 DIAGNOSIS — Z8261 Family history of arthritis: Secondary | ICD-10-CM | POA: Diagnosis not present

## 2017-12-05 LAB — CBC WITH DIFFERENTIAL/PLATELET
Abs Immature Granulocytes: 0.03 10*3/uL (ref 0.00–0.07)
Basophils Absolute: 0 10*3/uL (ref 0.0–0.1)
Basophils Relative: 0 %
Eosinophils Absolute: 0.1 10*3/uL (ref 0.0–0.5)
Eosinophils Relative: 1 %
HCT: 34.2 % — ABNORMAL LOW (ref 36.0–46.0)
Hemoglobin: 9.8 g/dL — ABNORMAL LOW (ref 12.0–15.0)
Immature Granulocytes: 0 %
Lymphocytes Relative: 31 %
Lymphs Abs: 2.7 10*3/uL (ref 0.7–4.0)
MCH: 21.1 pg — ABNORMAL LOW (ref 26.0–34.0)
MCHC: 28.7 g/dL — ABNORMAL LOW (ref 30.0–36.0)
MCV: 73.7 fL — ABNORMAL LOW (ref 80.0–100.0)
Monocytes Absolute: 0.8 10*3/uL (ref 0.1–1.0)
Monocytes Relative: 9 %
Neutro Abs: 5.2 10*3/uL (ref 1.7–7.7)
Neutrophils Relative %: 59 %
Platelets: 293 10*3/uL (ref 150–400)
RBC: 4.64 MIL/uL (ref 3.87–5.11)
RDW: 20.2 % — ABNORMAL HIGH (ref 11.5–15.5)
WBC: 8.8 10*3/uL (ref 4.0–10.5)
nRBC: 0 % (ref 0.0–0.2)

## 2017-12-05 LAB — URINALYSIS, ROUTINE W REFLEX MICROSCOPIC
BILIRUBIN URINE: NEGATIVE
GLUCOSE, UA: NEGATIVE mg/dL
Hgb urine dipstick: NEGATIVE
KETONES UR: NEGATIVE mg/dL
NITRITE: NEGATIVE
PROTEIN: NEGATIVE mg/dL
Specific Gravity, Urine: 1.027 (ref 1.005–1.030)
pH: 6 (ref 5.0–8.0)

## 2017-12-05 LAB — COMPREHENSIVE METABOLIC PANEL
ALBUMIN: 4.2 g/dL (ref 3.5–5.0)
ALK PHOS: 89 U/L (ref 38–126)
ALT: 28 U/L (ref 0–44)
ALT: 28 U/L (ref 0–44)
ANION GAP: 9 (ref 5–15)
AST: 25 U/L (ref 15–41)
AST: 24 U/L (ref 15–41)
Albumin: 3.9 g/dL (ref 3.5–5.0)
Alkaline Phosphatase: 91 U/L (ref 38–126)
Anion gap: 8 (ref 5–15)
BILIRUBIN TOTAL: 0.4 mg/dL (ref 0.3–1.2)
BUN: 11 mg/dL (ref 6–20)
BUN: 7 mg/dL (ref 6–20)
CO2: 24 mmol/L (ref 22–32)
CO2: 23 mmol/L (ref 22–32)
Calcium: 9.2 mg/dL (ref 8.9–10.3)
Calcium: 9.1 mg/dL (ref 8.9–10.3)
Chloride: 106 mmol/L (ref 98–111)
Chloride: 107 mmol/L (ref 98–111)
Creatinine, Ser: 0.65 mg/dL (ref 0.44–1.00)
Creatinine, Ser: 0.67 mg/dL (ref 0.44–1.00)
GFR calc Af Amer: >60 mL/min (ref >60)
GFR calc Af Amer: >60 mL/min (ref >60)
GFR calc non Af Amer: >60 mL/min (ref >60)
GFR calc non Af Amer: >60 mL/min (ref >60)
GLUCOSE: 117 mg/dL — AB (ref 70–99)
Glucose, Bld: 84 mg/dL (ref 70–99)
POTASSIUM: 3.8 mmol/L (ref 3.5–5.1)
Potassium: 3.4 mmol/L — ABNORMAL LOW (ref 3.5–5.1)
SODIUM: 139 mmol/L (ref 135–145)
Sodium: 138 mmol/L (ref 135–145)
Total Bilirubin: 0.4 mg/dL (ref 0.3–1.2)
Total Protein: 7.9 g/dL (ref 6.5–8.1)
Total Protein: 7.6 g/dL (ref 6.5–8.1)

## 2017-12-05 LAB — LIPASE, BLOOD
Lipase: 36 U/L (ref 11–51)
Lipase: 46 U/L (ref 11–51)

## 2017-12-05 LAB — DIFFERENTIAL
BASOS PCT: 0 %
Basophils Absolute: 0 10*3/uL (ref 0.0–0.1)
Eosinophils Absolute: 0 10*3/uL (ref 0.0–0.5)
Eosinophils Relative: 0 %
Lymphocytes Relative: 12 %
Lymphs Abs: 1.3 10*3/uL (ref 0.7–4.0)
MONO ABS: 0.2 10*3/uL (ref 0.1–1.0)
Monocytes Relative: 2 %
NEUTROS ABS: 9.5 10*3/uL — AB (ref 1.7–7.7)
NEUTROS PCT: 86 %

## 2017-12-05 LAB — SURGICAL PCR SCREEN
MRSA, PCR: NEGATIVE
STAPHYLOCOCCUS AUREUS: NEGATIVE

## 2017-12-05 LAB — HIV ANTIBODY (ROUTINE TESTING W REFLEX): HIV Screen 4th Generation wRfx: NONREACTIVE

## 2017-12-05 LAB — AMYLASE
Amylase: 161 U/L — ABNORMAL HIGH (ref 28–100)
Amylase: 120 U/L — ABNORMAL HIGH (ref 28–100)

## 2017-12-05 LAB — I-STAT BETA HCG BLOOD, ED (MC, WL, AP ONLY): I-stat hCG, quantitative: <5 m[IU]/mL (ref <5)

## 2017-12-05 LAB — TYPE AND SCREEN
ABO/RH(D): O POS
Antibody Screen: NEGATIVE

## 2017-12-05 LAB — HCG, QUANTITATIVE, PREGNANCY: hCG, Beta Chain, Quant, S: <1 m[IU]/mL (ref <5)

## 2017-12-05 MED ORDER — SIMETHICONE 80 MG PO CHEW
40.0000 mg | CHEWABLE_TABLET | Freq: Four times a day (QID) | ORAL | Status: DC | PRN
  Administered 2017-12-06: 40 mg via ORAL
  Filled 2017-12-05: qty 1

## 2017-12-05 MED ORDER — ONDANSETRON HCL 4 MG/2ML IJ SOLN
4.0000 mg | Freq: Once | INTRAMUSCULAR | Status: AC
  Administered 2017-12-05: 4 mg via INTRAVENOUS
  Filled 2017-12-05: qty 2

## 2017-12-05 MED ORDER — ONDANSETRON 4 MG PO TBDP: 4.0000 mg | ORAL_TABLET | Freq: Four times a day (QID) | ORAL | 0 refills | Status: DC | PRN

## 2017-12-05 MED ORDER — HYDROMORPHONE HCL 1 MG/ML IJ SOLN
1.0000 mg | Freq: Once | INTRAMUSCULAR | Status: AC
  Administered 2017-12-05: 1 mg via INTRAVENOUS
  Filled 2017-12-05: qty 1

## 2017-12-05 MED ORDER — HYDROMORPHONE HCL 1 MG/ML IJ SOLN
0.5000 mg | Freq: Once | INTRAMUSCULAR | Status: AC
  Administered 2017-12-05: 0.5 mg via INTRAVENOUS
  Filled 2017-12-05: qty 1

## 2017-12-05 MED ORDER — ONDANSETRON 4 MG PO TBDP
4.0000 mg | ORAL_TABLET | Freq: Four times a day (QID) | ORAL | Status: DC | PRN
  Administered 2017-12-05: 4 mg via ORAL
  Filled 2017-12-05: qty 1

## 2017-12-05 MED ORDER — ONDANSETRON HCL 4 MG/2ML IJ SOLN: 4.0000 mg | Freq: Four times a day (QID) | INTRAMUSCULAR | Status: DC | PRN

## 2017-12-05 MED ORDER — ENOXAPARIN SODIUM 40 MG/0.4ML ~~LOC~~ SOLN
40.0000 mg | SUBCUTANEOUS | Status: DC
  Administered 2017-12-06: 40 mg via SUBCUTANEOUS
  Filled 2017-12-05: qty 0.4

## 2017-12-05 MED ORDER — LACTATED RINGERS IV SOLN
INTRAVENOUS | Status: DC
  Administered 2017-12-05: via INTRAVENOUS

## 2017-12-05 MED ORDER — MORPHINE SULFATE (PF) 4 MG/ML IV SOLN
4.0000 mg | Freq: Once | INTRAVENOUS | Status: AC
  Administered 2017-12-05: 4 mg via INTRAVENOUS
  Filled 2017-12-05: qty 1

## 2017-12-05 MED ORDER — MORPHINE SULFATE (PF) 2 MG/ML IV SOLN
2.0000 mg | INTRAVENOUS | Status: DC | PRN
  Administered 2017-12-05 – 2017-12-06 (×3): 2 mg via INTRAVENOUS
  Filled 2017-12-05 (×3): qty 1

## 2017-12-05 MED ORDER — OXYCODONE-ACETAMINOPHEN 5-325 MG PO TABS: 1.0000 | ORAL_TABLET | Freq: Four times a day (QID) | ORAL | 0 refills | Status: DC | PRN

## 2017-12-05 MED ORDER — ACETAMINOPHEN 325 MG PO TABS
650.0000 mg | ORAL_TABLET | Freq: Four times a day (QID) | ORAL | Status: DC | PRN
  Administered 2017-12-06: 650 mg via ORAL
  Filled 2017-12-05: qty 2

## 2017-12-05 MED ORDER — IOHEXOL 300 MG/ML  SOLN
100.0000 mL | Freq: Once | INTRAMUSCULAR | Status: AC | PRN
  Administered 2017-12-05: 100 mL via INTRAVENOUS

## 2017-12-05 MED ORDER — ACETAMINOPHEN 650 MG RE SUPP: 650.0000 mg | Freq: Four times a day (QID) | RECTAL | Status: DC | PRN

## 2017-12-05 MED ORDER — SODIUM CHLORIDE 0.9 % IV SOLN
2.0000 g | INTRAVENOUS | Status: DC
  Administered 2017-12-05 – 2017-12-06 (×2): 2 g via INTRAVENOUS
  Filled 2017-12-05 (×2): qty 20

## 2017-12-05 MED ORDER — SODIUM CHLORIDE 0.9 % IV SOLN
INTRAVENOUS | Status: DC
  Administered 2017-12-05: 20:00:00 via INTRAVENOUS

## 2017-12-05 MED ORDER — OXYCODONE HCL 5 MG PO TABS
5.0000 mg | ORAL_TABLET | ORAL | Status: DC | PRN
  Administered 2017-12-05 – 2017-12-07 (×5): 10 mg via ORAL
  Administered 2017-12-07: 5 mg via ORAL
  Filled 2017-12-05 (×6): qty 2

## 2017-12-05 NOTE — ED Notes (Signed)
Pt c/o nausea.  

## 2017-12-05 NOTE — ED Provider Notes (Signed)
[facility] EMERGENCY DEPARTMENT Provider Note   CSN: [id number] Arrival date & time: 12/05/17  1245     History   Chief Complaint Chief Complaint  Patient presents with  . Abdominal Pain    HPI Victoria Knight is a 32 y.o. female with history of iron deficiency anemia presents for evaluation of acute onset, progressively worsening right upper quadrant abdominal pain.  Pain started yesterday at around 5 PM.  She states that the pain was primarily localized to the upper abdomen but worst in the right upper quadrant.  Pain is constant, sharp, worsens with movement and palpation.  She went to the MAU yesterday and work-up was significant for right upper quadrant ultrasound which showed cholelithiasis with no evidence of cholecystitis.  She was given instructions to follow-up with general surgery on an outpatient basis.  She was also given Percocet which she states has been helpful.  She had several episodes of nonbloody nonbilious emesis yesterday, none today as she has been taking an antiemetic.  She has been able to tolerate p.o. food intake today.  Reports she had a similar episode of abdominal pain after giving birth in July.  Denies fevers, chest pain, shortness of breath, diarrhea, constipation, urinary symptoms, melena, hematochezia, vaginal itching, bleeding, or discharge.  She is currently breast feeding.  She states that she received a phone call from her OB/GYN today telling her to present to the ED for evaluation by general surgery.   The history is provided by the patient.    Past Medical History:  Diagnosis Date  . Anemia   . Contact lens/glasses fitting   . Iron deficiency anemia 10/27/2011  . Normal labor 10/26/2011  . Postpartum care following cesarean delivery 10/28/2011  . PROM (premature rupture of membranes) 10/26/2011  . S/P cesarean section (arrest of dialation, 10/13) 10/28/2011  . SOB (shortness of breath)     Patient Active Problem List   Diagnosis Date Noted  . Postpartum care following cesarean delivery (7/29) 08/12/2017  . IDA (iron deficiency anemia) 08/12/2017  . Status post repeat low transverse cesarean section 08/11/2017  . Post-dates pregnancy 08/10/2017  . BIPOLAR DISORDER 03/13/2006  . ANXIETY 03/13/2006  . PANIC ATTACKS 03/13/2006    Past Surgical History:  Procedure Laterality Date  . CESAREAN SECTION  10/27/2011   Procedure: CESAREAN SECTION;  Surgeon: Genia Del, MD;  Location: [facility];  Service: Obstetrics;  Laterality: N/A;  Primary cesarean section with delivery of baby boy at 87. Apgars  9/9.  Marland Kitchen CESAREAN SECTION N/A 08/11/2017   Procedure: CESAREAN SECTION;  Surgeon: Shea Evans, MD;  Location: [facility] BIRTHING SUITES;  Service: Obstetrics;  Laterality: N/A;  . LAPAROSCOPIC GASTRIC BANDING  09/13/08  . OVARIAN CYST REMOVAL Left 2018     OB History    Gravida  2   Para  2   Term  2   Preterm  0   AB  0   Living  2     SAB  0   TAB  0   Ectopic  0   Multiple  0   Live Births  2            Home Medications    Prior to Admission medications   Medication Sig Start Date End Date Taking? Authorizing Provider  ferrous sulfate 325 (65 FE) MG tablet Take 325 mg by mouth daily with breakfast.    [provider]  hydrochlorothiazide (MICROZIDE) 12.5 MG capsule Take 1 capsule (12.5  mg total) by mouth daily. 08/13/17   Lawhorn, Vanessa DurhamJenkins Michelle, CNM  ibuprofen (ADVIL,MOTRIN) 800 MG tablet Take 1 tablet (800 mg total) by mouth every 8 (eight) hours as needed. 08/17/17   Katrinka BlazingSmith, [place], CNM  magnesium oxide (MAG-OX) 400 (241.3 Mg) MG tablet Take 1 tablet (400 mg total) by mouth daily. 08/13/17   Lawhorn, Vanessa DurhamJenkins Michelle, CNM  ondansetron (ZOFRAN ODT) 4 MG disintegrating tablet Take 1 tablet (4 mg total) by mouth every 6 (six) hours as needed for nausea. 12/05/17   Aviva SignsWilliams, Marie L, CNM  ondansetron (ZOFRAN) 4 MG tablet Take 1 tablet (4 mg total) by mouth every 8 (eight) hours  as needed for nausea or vomiting. 08/17/17   Katrinka BlazingSmith, [place], CNM  oxyCODONE-acetaminophen (PERCOCET/ROXICET) 5-325 MG tablet Take 1-2 tablets by mouth every 6 (six) hours as needed. 12/05/17   Aviva SignsWilliams, Marie L, CNM  pantoprazole (PROTONIX) 20 MG tablet Take 20 mg by mouth 2 (two) times daily.    [provider]  Prenatal Vit-Fe Fumarate-FA (PRENATAL MULTIVITAMIN) TABS tablet Take 1 tablet by mouth daily at 12 noon.    [provider]  simethicone (MYLICON) 80 MG chewable tablet Chew 1 tablet (80 mg total) by mouth as needed for flatulence. 08/13/17   Lawhorn, Vanessa DurhamJenkins Michelle, CNM  traMADol (ULTRAM) 50 MG tablet Take 1-2 tablets (50-100 mg total) by mouth every 6 (six) hours as needed for severe pain. 08/17/17   Dorathy KinsmanSmith, [place], CNM    Family History Family History  Problem Relation Age of Onset  . Heart disease Father        congestive heart failure  . Hypertension Father   . Arthritis Father   . Diabetes Father   . Gout Father     Social History Social History   Tobacco Use  . Smoking status: Never Smoker  . Smokeless tobacco: Never Used  Substance Use Topics  . Alcohol use: No  . Drug use: No     Allergies   Patient has no known allergies.   Review of Systems Review of Systems  Constitutional: Negative for chills and fever.  Respiratory: Negative for shortness of breath.   Cardiovascular: Negative for chest pain.  Gastrointestinal: Positive for abdominal pain, nausea and vomiting. Negative for constipation and diarrhea.  All other systems reviewed and are negative.    Physical Exam Updated Vital Signs BP 125/70   Pulse 87   Temp 99.1 F (37.3 C) (Oral)   Resp 18   SpO2 100%   Physical Exam  Constitutional: She appears well-developed and well-nourished. No distress.  HENT:  Head: Normocephalic and atraumatic.  Eyes: Conjunctivae are normal. Right eye exhibits no discharge. Left eye exhibits no discharge.  Neck: No JVD present. No tracheal  deviation present.  Cardiovascular: Normal rate, regular rhythm and normal heart sounds.  Pulmonary/Chest: Effort normal and breath sounds normal.  Abdominal: Soft. Bowel sounds are normal. She exhibits no distension. There is tenderness in the right upper quadrant, epigastric area and left upper quadrant. There is guarding and positive Murphy's sign. There is no rigidity, no CVA tenderness and no tenderness at McBurney's point.  Maximally tender in the right upper quadrant  Musculoskeletal: She exhibits no edema.  Neurological: She is alert.  Skin: Skin is warm and dry. No erythema.  Psychiatric: She has a normal mood and affect. Her behavior is normal.  Nursing note and vitals reviewed.    ED Treatments / Results  Labs (all labs ordered are listed, but only abnormal results are  displayed) Labs Reviewed  COMPREHENSIVE METABOLIC PANEL - Abnormal; Notable for the following components:      Result Value   Potassium 3.4 (*)    All other components within normal limits  CBC WITH DIFFERENTIAL/PLATELET - Abnormal; Notable for the following components:   Hemoglobin 9.8 (*)    HCT 34.2 (*)    MCV 73.7 (*)    MCH 21.1 (*)    MCHC 28.7 (*)    RDW 20.2 (*)    All other components within normal limits  LIPASE, BLOOD  AMYLASE  I-STAT BETA HCG BLOOD, ED ([facility], [facility], [facility] ONLY)    EKG None  Radiology US Abdomen Complete  Result Date: 12/05/2017 CLINICAL DATA:  Upper abdominal pain. EXAM: ABDOMEN ULTRASOUND COMPLETE COMPARISON:  CT scan August 17, 2017 FINDINGS: Gallbladder: Cholelithiasis without wall thickening or pericholecystic fluid. A Murphy's sign cannot be well assessed as the patient was given pain medications. Common bile duct: Diameter: 4.7 mm Liver: No focal mass. Portal vein is patent on color Doppler imaging with normal direction of blood flow towards the liver. IVC: No abnormality visualized. Pancreas: Visualized portion unremarkable. Spleen: Size and appearance within normal limits.  Right Kidney: Length: 11.4 cm. Echogenicity within normal limits. No mass or hydronephrosis visualized. Left Kidney: Length: 11.0 cm. Echogenicity within normal limits. No mass or hydronephrosis visualized. Abdominal aorta: No aneurysm visualized. Other findings: None. IMPRESSION: 1. Cholelithiasis without wall thickening or pericholecystic fluid. A Murphy's sign cannot be assessed as the patient was given pain medications. If there is continued clinical concern for acute cholecystitis, recommend a HIDA scan. 2. No other significant abnormalities. Electronically Signed   By: Gerome Sam III M.D   On: 12/05/2017 01:07   Ct Abdomen Pelvis W Contrast  Result Date: 12/05/2017 CLINICAL DATA:  Right upper quadrant pain, nausea and vomiting. EXAM: CT ABDOMEN AND PELVIS WITH CONTRAST TECHNIQUE: Multidetector CT imaging of the abdomen and pelvis was performed using the standard protocol following bolus administration of intravenous contrast. CONTRAST:  OMNIPAQUE IOHEXOL 300 MG/ML  SOLN COMPARISON:  CT 08/17/2017, right upper quadrant abdominal ultrasound 12/05/2017 FINDINGS: Lower chest: Top-normal heart size. Clear lung bases. Hepatobiliary: Gallbladder wall edema and thickening with gallstones noted, better visualized on the coronal reformats. No biliary dilatation. No hepatic mass. Pancreas: Mild peripancreatic edema may be sympathetic from acute cholecystitis or pancreatitis. No necrosis, ductal dilatation or mass. Spleen: Normal Adrenals/Urinary Tract: Normal bilateral adrenal glands, kidneys and ureters. Urinary bladder is physiologically distended without focal mural thickening or calculus. Stomach/Bowel: Gastric band device in place. No bowel obstruction or inflammation. Normal appendix. Vascular/Lymphatic: No significant vascular findings are present. No enlarged abdominal or pelvic lymph nodes. Reproductive: Uterus and bilateral adnexa are unremarkable. Other: Tiny periumbilical fat containing  hernia. No free air nor free fluid. Musculoskeletal: No acute or significant osseous findings. IMPRESSION: 1. Peripancreatic edema consistent with acute pancreatitis or sympathetic secondary to adjacent cholecystitis. 2. Edematous appearing moderately distended gallbladder with faint gallstones suspicious for acute cholecystitis. Electronically Signed   By: Tollie Eth M.D.   On: 12/05/2017 16:03    Procedures Procedures (including critical care time)  Medications Ordered in ED Medications  morphine 4 MG/ML injection 4 mg (4 mg Intravenous Given 12/05/17 1336)  ondansetron (ZOFRAN) injection 4 mg (4 mg Intravenous Given 12/05/17 1336)  HYDROmorphone (DILAUDID) injection 0.5 mg (0.5 mg Intravenous Given 12/05/17 1502)  iohexol (OMNIPAQUE) 300 MG/ML solution 100 mL (100 mLs Intravenous Contrast Given 12/05/17 1546)     Initial Impression /  Assessment and Plan / ED Course  I have reviewed the triage vital signs and the nursing notes.  Pertinent labs & imaging results that were available during my care of the patient were reviewed by me and considered in my medical decision making (see chart for details).     Patient with ongoing right upper quadrant abdominal pain.  She is afebrile, vital signs are stable.  She is uncomfortable but nontoxic in appearance.  Went to the MAU yesterday with right upper quadrant ultrasound significant for cholelithiasis without definitive evidence of cholecystitis.  At the time LFTs were normal, she did have an elevated amylase.  In the ED here today she has continued tenderness to palpation of the right upper quadrant with guarding and positive Murphy's.  Lab work reviewed by me shows no leukocytosis, no metabolic derangements, LFTs within normal limits.  Lipase and creatinine within normal limits.  UA does not suggest UTI or nephrolithiasis.  No concern for PID, TOA, ovarian torsion, ectopic pregnancy in the absence of lower abdominal pain.  Will obtain CT scan of  the abdomen and pelvis for further evaluation.   CT scan shows peripancreatic edema consistent with acute pancreatitis versus sympathetic edema secondary to adjacent cholecystitis.  Also shows edematous appearing moderately distended gallbladder with faint gallstones suspicious for acute cholecystitis.  I spoke with Nehemiah Settle with general surgery who will assist the patient in the ED emergently.  Patient endorses ongoing pain but has not had any active vomiting in the ED. It did improve with IV pain medications and zofran.    5:29 PM Signed out to oncoming provider Dr. Caro Hight.  Awaiting general surgery recommendations.  Patient may require admission with plan for cholecystectomy versus GI consultation for HIDA/MRCP.  Final Clinical Impressions(s) / ED Diagnoses   Final diagnoses:  Right upper quadrant abdominal pain    ED Discharge Orders    None       Jeanie Sewer, PA-C 12/05/17 1730    Virgina Norfolk, DO 12/05/17 1746

## 2017-12-05 NOTE — H&P (Signed)
Victoria Knight is an 32 y.o. female.   Chief Complaint: ruq pain HPI: 89 yof presents to er with unrelenting ruq pain. [facility]. Has had this pain in past and has been admitted a couple times since delivery this summer.  She began having pain ruq that radiates to back, not going away, nothing making it better, associated with n/v, no fevers, having normal bms. Had [place] last night that shows gallstones and ct shows same. Asked to see her for gallstones and abd pain  Past Medical History:  Diagnosis Date  . Anemia   . Contact lens/glasses fitting   . Iron deficiency anemia 10/27/2011  . Normal labor 10/26/2011  . Postpartum care following cesarean delivery 10/28/2011  . PROM (premature rupture of membranes) 10/26/2011  . S/P cesarean section (arrest of dialation, 10/13) 10/28/2011  . SOB (shortness of breath)     Past Surgical History:  Procedure Laterality Date  . CESAREAN SECTION  10/27/2011   Procedure: CESAREAN SECTION;  Surgeon: Princess Bruins, MD;  Location: [facility];  Service: Obstetrics;  Laterality: N/A;  Primary cesarean section with delivery of baby boy at 46. Apgars  9/9.  Marland Kitchen CESAREAN SECTION N/A 08/11/2017   Procedure: CESAREAN SECTION;  Surgeon: Azucena Fallen, MD;  Location: [facility];  Service: Obstetrics;  Laterality: N/A;  . LAPAROSCOPIC GASTRIC BANDING  09/13/08  . OVARIAN CYST REMOVAL Left 2018    Family History  Problem Relation Age of Onset  . Heart disease Father        congestive heart failure  . Hypertension Father   . Arthritis Father   . Diabetes Father   . Gout Father    Social History:  reports that she has never smoked. She has never used smokeless tobacco. She reports that she does not drink alcohol or use drugs.  Allergies: No Known Allergies  meds reviewed  Results for orders placed or performed during the hospital encounter of 12/05/17 (from the past 48 hour(s))  Comprehensive metabolic panel     Status: Abnormal    Collection Time: 12/05/17  1:06 PM  Result Value Ref Range   Sodium 138 135 - 145 mmol/L   Potassium 3.4 (L) 3.5 - 5.1 mmol/L   Chloride 107 98 - 111 mmol/L   CO2 23 22 - 32 mmol/L   Glucose, Bld 84 70 - 99 mg/dL   BUN 7 6 - 20 mg/dL   Creatinine, Ser 0.67 0.44 - 1.00 mg/dL   Calcium 9.1 8.9 - 10.3 mg/dL   Total Protein 7.6 6.5 - 8.1 g/dL   Albumin 3.9 3.5 - 5.0 g/dL   AST 24 15 - 41 U/L   ALT 28 0 - 44 U/L   Alkaline Phosphatase 91 38 - 126 U/L   Total Bilirubin 0.4 0.3 - 1.2 mg/dL   GFR calc non Af Amer >60 >60 mL/min   GFR calc Af Amer >60 >60 mL/min    Comment: (NOTE) The eGFR has been calculated using the CKD EPI equation. This calculation has not been validated in all clinical situations. eGFR's persistently <60 mL/min signify possible Chronic Kidney Disease.    Anion gap 8 5 - 15    Comment: Performed at [facility] [address] [place], [place] [address]  CBC with Differential     Status: Abnormal   Collection Time: 12/05/17  1:06 PM  Result Value Ref Range   WBC 8.8 4.0 - 10.5 K/uL   RBC 4.64 3.87 -  5.11 MIL/uL   Hemoglobin 9.8 (L) 12.0 - 15.0 g/dL   HCT 34.2 (L) 36.0 - 46.0 %   MCV 73.7 (L) 80.0 - 100.0 fL   MCH 21.1 (L) 26.0 - 34.0 pg   MCHC 28.7 (L) 30.0 - 36.0 g/dL   RDW 20.2 (H) 11.5 - 15.5 %   Platelets 293 150 - 400 K/uL   nRBC 0.0 0.0 - 0.2 %   Neutrophils Relative % 59 %   Neutro Abs 5.2 1.7 - 7.7 K/uL   Lymphocytes Relative 31 %   Lymphs Abs 2.7 0.7 - 4.0 K/uL   Monocytes Relative 9 %   Monocytes Absolute 0.8 0.1 - 1.0 K/uL   Eosinophils Relative 1 %   Eosinophils Absolute 0.1 0.0 - 0.5 K/uL   Basophils Relative 0 %   Basophils Absolute 0.0 0.0 - 0.1 K/uL   Immature Granulocytes 0 %   Abs Immature Granulocytes 0.03 0.00 - 0.07 K/uL    Comment: Performed at [facility] [address] [place], [place] [address]  Lipase, blood     Status: None   Collection Time: 12/05/17  1:06 PM  Result Value Ref Range   Lipase 46 11 - 51 U/L     Comment: Performed at [facility] [address], [place], [place] [address]  I-Stat beta hCG blood, ED     Status: None   Collection Time: 12/05/17  2:48 PM  Result Value Ref Range   I-stat hCG, quantitative <5.0 <5 mIU/mL   Comment 3            Comment:   GEST. AGE      CONC.  (mIU/mL)   <=1 WEEK        5 - 50     2 WEEKS       50 - 500     3 WEEKS       100 - 10,000     4 WEEKS     1,000 - 30,000        FEMALE AND NON-PREGNANT FEMALE:     LESS THAN 5 mIU/mL    US Abdomen Complete  Result Date: 12/05/2017 CLINICAL DATA:  Upper abdominal pain. EXAM: ABDOMEN ULTRASOUND COMPLETE COMPARISON:  CT scan August 17, 2017 FINDINGS: Gallbladder: Cholelithiasis without wall thickening or pericholecystic fluid. A Murphy's sign cannot be well assessed as the patient was given pain medications. Common bile duct: Diameter: 4.7 mm Liver: No focal mass. Portal vein is patent on color Doppler imaging with normal direction of blood flow towards the liver. IVC: No abnormality visualized. Pancreas: Visualized portion unremarkable. Spleen: Size and appearance within normal limits. Right Kidney: Length: 11.4 cm. Echogenicity within normal limits. No mass or hydronephrosis visualized. Left Kidney: Length: 11.0 cm. Echogenicity within normal limits. No mass or hydronephrosis visualized. Abdominal aorta: No aneurysm visualized. Other findings: None. IMPRESSION: 1. Cholelithiasis without wall thickening or pericholecystic fluid. A Murphy's sign cannot be assessed as the patient was given pain medications. If there is continued clinical concern for acute cholecystitis, recommend a HIDA scan. 2. No other significant abnormalities. Electronically Signed   By: Dorise Bullion III M.D   On: 12/05/2017 01:07   Ct Abdomen Pelvis W Contrast  Result Date: 12/05/2017 CLINICAL DATA:  Right upper quadrant pain, nausea and vomiting. EXAM: CT ABDOMEN AND PELVIS WITH CONTRAST TECHNIQUE: Multidetector CT imaging of the  abdomen and pelvis was performed using the standard protocol following bolus administration of intravenous contrast. CONTRAST:  161m OMNIPAQUE IOHEXOL 300 MG/ML  SOLN COMPARISON:  CT 08/17/2017, right upper quadrant abdominal ultrasound 12/05/2017 FINDINGS: Lower chest: Top-normal heart size. Clear lung bases. Hepatobiliary: Gallbladder wall edema and thickening with gallstones noted, better visualized on the coronal reformats. No biliary dilatation. No hepatic mass. Pancreas: Mild peripancreatic edema may be sympathetic from acute cholecystitis or pancreatitis. No necrosis, ductal dilatation or mass. Spleen: Normal Adrenals/Urinary Tract: Normal bilateral adrenal glands, kidneys and ureters. Urinary bladder is physiologically distended without focal mural thickening or calculus. Stomach/Bowel: Gastric band device in place. No bowel obstruction or inflammation. Normal appendix. Vascular/Lymphatic: No significant vascular findings are present. No enlarged abdominal or pelvic lymph nodes. Reproductive: Uterus and bilateral adnexa are unremarkable. Other: Tiny periumbilical fat containing hernia. No free air nor free fluid. Musculoskeletal: No acute or significant osseous findings. IMPRESSION: 1. Peripancreatic edema consistent with acute pancreatitis or sympathetic secondary to adjacent cholecystitis. 2. Edematous appearing moderately distended gallbladder with faint gallstones suspicious for acute cholecystitis. Electronically Signed   By: DAshley RoyaltyM.D.   On: 12/05/2017 16:03    Review of Systems  Gastrointestinal: Positive for abdominal pain, nausea and vomiting.  All other systems reviewed and are negative.   Blood pressure 125/70, pulse 87, temperature 99.1 F (37.3 C), temperature source Oral, resp. rate 18, SpO2 100 %, unknown if currently breastfeeding. Physical Exam  Vitals reviewed. Constitutional: She is oriented to person, place, and time. She appears well-developed and well-nourished.   HENT:  Head: Normocephalic and atraumatic.  Right Ear: External ear normal.  Left Ear: External ear normal.  Mouth/Throat: Oropharynx is clear and moist.  Eyes: Pupils are equal, round, and reactive to light. No scleral icterus.  Neck: Neck supple.  Cardiovascular: Normal rate, regular rhythm, normal heart sounds and intact distal pulses.  Respiratory: Effort normal and breath sounds normal. She has no wheezes.  GI: Soft. Bowel sounds are normal. She exhibits no distension. There is tenderness in the right upper quadrant.  Musculoskeletal: She exhibits no edema or tenderness.  Lymphadenopathy:    She has no cervical adenopathy.  Neurological: She is alert and oriented to person, place, and time.  Skin: Skin is warm and dry.  Psychiatric: She has a normal mood and affect. Her behavior is normal.     Assessment/Plan Cholecystitis  She has cholecystitis by exam. [place] gallstones. Does not have pancreatitis.  Will admit her, iv abx, plan for lap chole in am. Alternatives mentioned. I discussed the procedure in detail.  We discussed the risks and benefits of a laparoscopic cholecystectomy and possible cholangiogram including, but not limited to bleeding, infection, injury to surrounding structures such as the intestine or liver, bile leak, retained gallstones, need to convert to an open procedure, prolonged diarrhea, blood clots such as  DVT, common bile duct injury, anesthesia risks, and possible need for additional procedures.     MRolm Bookbinder MD 12/05/2017, 5:38 PM

## 2017-12-05 NOTE — ED Notes (Signed)
To c-t then returned 

## 2017-12-05 NOTE — Discharge Instructions (Signed)
Cholelithiasis °Cholelithiasis is a form of gallbladder disease in which gallstones form in the gallbladder. The gallbladder is an organ that stores bile. Bile is made in the liver, and it helps to digest fats. Gallstones begin as small crystals and slowly grow into stones. They may cause no symptoms until the gallbladder tightens (contracts) and a gallstone is blocking the duct (gallbladder attack), which can cause pain. Cholelithiasis is also referred to as gallstones. °There are two main types of gallstones: °· Cholesterol stones. These are made of hardened cholesterol and are usually yellow-green in color. They are the most common type of gallstone. Cholesterol is a white, waxy, fat-like substance that is made in the liver. °· Pigment stones. These are dark in color and are made of a red-yellow substance that forms when hemoglobin from red blood cells breaks down (bilirubin). ° °What are the causes? °This condition may be caused by an imbalance in the substances that bile is made of. This can happen if the bile: °· Has too much bilirubin. °· Has too much cholesterol. °· Does not have enough bile salts. These salts help the body absorb and digest fats. ° °In some cases, this condition can also be caused by the gallbladder not emptying completely or often enough. °What increases the risk? °The following factors may make you more likely to develop this condition: °· Being female. °· Having multiple pregnancies. Health care providers sometimes advise removing diseased gallbladders before future pregnancies. °· Eating a diet that is heavy in fried foods, fat, and refined carbohydrates, like white bread and white rice. °· Being obese. °· Being older than age 40. °· Prolonged use of medicines that contain female hormones (estrogen). °· Having diabetes mellitus. °· Rapidly losing weight. °· Having a family history of gallstones. °· Being of American Indian or Mexican descent. °· Having an intestinal disease such as  Crohn disease. °· Having metabolic syndrome. °· Having cirrhosis. °· Having severe types of anemia such as sickle cell anemia. ° °What are the signs or symptoms? °In most cases, there are no symptoms. These are known as silent gallstones. If a gallstone blocks the bile ducts, it can cause a gallbladder attack. The main symptom of a gallbladder attack is sudden pain in the upper right abdomen. The pain usually comes at night or after eating a large meal. The pain can last for one or several hours and can spread to the right shoulder or chest. °If the bile duct is blocked for more than a few hours, it can cause infection or inflammation of the gallbladder, liver, or pancreas, which may cause: °· Nausea. °· Vomiting. °· Abdominal pain that lasts for 5 hours or more. °· Fever or chills. °· Yellowing of the skin or the whites of the eyes (jaundice). °· Dark urine. °· Light-colored stools. ° °How is this diagnosed? °This condition may be diagnosed based on: °· A physical exam. °· Your medical history. °· An ultrasound of your gallbladder. °· CT scan. °· MRI. °· Blood tests to check for signs of infection or inflammation. °· A scan of your gallbladder and bile ducts (biliary system) using nonharmful radioactive material and special cameras that can see the radioactive material (cholescintigram). This test checks to see how your gallbladder contracts and whether bile ducts are blocked. °· Inserting a small tube with a camera on the end (endoscope) through your mouth to inspect bile ducts and check for blockages (endoscopic retrograde cholangiopancreatogram). ° °How is this treated? °Treatment for gallstones depends on the   severity of the condition. Silent gallstones do not need treatment. If the gallstones cause a gallbladder attack or other symptoms, treatment may be required. Options for treatment include: °· Surgery to remove the gallbladder (cholecystectomy). This is the most common treatment. °· Medicines to dissolve  gallstones. These are most effective at treating small gallstones. You may need to take medicines for up to 6-12 months. °· Shock wave treatment (extracorporeal biliary lithotripsy). In this treatment, an ultrasound machine sends shock waves to the gallbladder to break gallstones into smaller pieces. These pieces can then be passed into the intestines or be dissolved by medicine. This is rarely used. °· Removing gallstones through endoscopic retrograde cholangiopancreatogram. A small basket can be attached to the endoscope and used to capture and remove gallstones. ° °Follow these instructions at home: °· Take over-the-counter and prescription medicines only as told by your health care provider. °· Maintain a healthy weight and follow a healthy diet. This includes: °? Reducing fatty foods, such as fried food. °? Reducing refined carbohydrates, like white bread and white rice. °? Increasing fiber. Aim for foods like almonds, fruit, and beans. °· Keep all follow-up visits as told by your health care provider. This is important. °Contact a health care provider if: °· You think you have had a gallbladder attack. °· You have been diagnosed with silent gallstones and you develop abdominal pain or indigestion. °Get help right away if: °· You have pain from a gallbladder attack that lasts for more than 2 hours. °· You have abdominal pain that lasts for more than 5 hours. °· You have a fever or chills. °· You have persistent nausea and vomiting. °· You develop jaundice. °· You have dark urine or light-colored stools. °Summary °· Cholelithiasis (also called gallstones) is a form of gallbladder disease in which gallstones form in the gallbladder. °· This condition is caused by an imbalance in the substances that make up bile. This can happen if the bile has too much cholesterol, too much bilirubin, or not enough bile salts. °· You are more likely to develop this condition if you are female, pregnant, using medicines with  estrogen, obese, older than age 40, or have a family history of gallstones. You may also develop gallstones if you have diabetes, an intestinal disease, cirrhosis, or metabolic syndrome. °· Treatment for gallstones depends on the severity of the condition. Silent gallstones do not need treatment. °· If gallstones cause a gallbladder attack or other symptoms, treatment may be needed. The most common treatment is surgery to remove the gallbladder. °This information is not intended to replace advice given to you by your health care provider. Make sure you discuss any questions you have with your health care provider. °Document Released: 12/27/2004 Document Revised: 09/17/2015 Document Reviewed: 09/17/2015 °Elsevier Interactive Patient Education © 2018 [employer] ° °

## 2017-12-05 NOTE — ED Triage Notes (Signed)
Pt here from home with c/o right upper quadrant pain , pt had an US yesterday at [facility]

## 2017-12-05 NOTE — ED Notes (Signed)
The pt is waiting for c-t scan

## 2017-12-06 ENCOUNTER — Inpatient Hospital Stay (HOSPITAL_COMMUNITY): Payer: BLUE CROSS/BLUE SHIELD | Admitting: Anesthesiology

## 2017-12-06 ENCOUNTER — Encounter (HOSPITAL_COMMUNITY): Admission: EM | Disposition: A | Discharge status: Home / Self Care | Payer: Self-pay | Source: Home / Self Care

## 2017-12-06 HISTORY — PX: CHOLECYSTECTOMY: SHX55

## 2017-12-06 LAB — COMPREHENSIVE METABOLIC PANEL
ALT: 26 U/L (ref 0–44)
ANION GAP: 7 (ref 5–15)
AST: 20 U/L (ref 15–41)
Albumin: 3.3 g/dL — ABNORMAL LOW (ref 3.5–5.0)
Alkaline Phosphatase: 79 U/L (ref 38–126)
BUN: 6 mg/dL (ref 6–20)
CO2: 24 mmol/L (ref 22–32)
Calcium: 8.7 mg/dL — ABNORMAL LOW (ref 8.9–10.3)
Chloride: 107 mmol/L (ref 98–111)
Creatinine, Ser: 0.83 mg/dL (ref 0.44–1.00)
Glucose, Bld: 85 mg/dL (ref 70–99)
POTASSIUM: 3.4 mmol/L — AB (ref 3.5–5.1)
SODIUM: 138 mmol/L (ref 135–145)
Total Bilirubin: 0.4 mg/dL (ref 0.3–1.2)
Total Protein: 6.3 g/dL — ABNORMAL LOW (ref 6.5–8.1)

## 2017-12-06 LAB — CBC
HEMATOCRIT: 30.5 % — AB (ref 36.0–46.0)
HEMOGLOBIN: 8.8 g/dL — AB (ref 12.0–15.0)
MCH: 21 pg — ABNORMAL LOW (ref 26.0–34.0)
MCHC: 28.9 g/dL — ABNORMAL LOW (ref 30.0–36.0)
MCV: 72.8 fL — ABNORMAL LOW (ref 80.0–100.0)
NRBC: 0 % (ref 0.0–0.2)
Platelets: 263 10*3/uL (ref 150–400)
RBC: 4.19 MIL/uL (ref 3.87–5.11)
RDW: 19.9 % — AB (ref 11.5–15.5)
WBC: 5.8 10*3/uL (ref 4.0–10.5)

## 2017-12-06 SURGERY — LAPAROSCOPIC CHOLECYSTECTOMY
Anesthesia: General | Site: Abdomen

## 2017-12-06 MED ORDER — PROMETHAZINE HCL 25 MG/ML IJ SOLN
6.2500 mg | INTRAMUSCULAR | Status: DC | PRN
  Administered 2017-12-06: 12.5 mg via INTRAVENOUS

## 2017-12-06 MED ORDER — EPHEDRINE 5 MG/ML INJ
INTRAVENOUS | Status: AC
  Filled 2017-12-06: qty 10

## 2017-12-06 MED ORDER — BUPIVACAINE-EPINEPHRINE (PF) 0.25% -1:200000 IJ SOLN
INTRAMUSCULAR | Status: AC
  Filled 2017-12-06: qty 30

## 2017-12-06 MED ORDER — DIPHENHYDRAMINE HCL 50 MG/ML IJ SOLN
INTRAMUSCULAR | Status: AC
  Filled 2017-12-06: qty 1

## 2017-12-06 MED ORDER — ONDANSETRON HCL 4 MG/2ML IJ SOLN
INTRAMUSCULAR | Status: AC
  Filled 2017-12-06: qty 2

## 2017-12-06 MED ORDER — FENTANYL CITRATE (PF) 100 MCG/2ML IJ SOLN
INTRAMUSCULAR | Status: AC
  Administered 2017-12-06: 50 ug via INTRAVENOUS
  Filled 2017-12-06: qty 2

## 2017-12-06 MED ORDER — SUGAMMADEX SODIUM 200 MG/2ML IV SOLN
INTRAVENOUS | Status: DC | PRN
  Administered 2017-12-06: 500 mg via INTRAVENOUS

## 2017-12-06 MED ORDER — PROPOFOL 10 MG/ML IV BOLUS
INTRAVENOUS | Status: AC
  Filled 2017-12-06: qty 20

## 2017-12-06 MED ORDER — MIDAZOLAM HCL 2 MG/2ML IJ SOLN
INTRAMUSCULAR | Status: AC
  Filled 2017-12-06: qty 2

## 2017-12-06 MED ORDER — DIPHENHYDRAMINE HCL 50 MG/ML IJ SOLN
INTRAMUSCULAR | Status: DC | PRN
  Administered 2017-12-06: 6.25 mg via INTRAVENOUS

## 2017-12-06 MED ORDER — FENTANYL CITRATE (PF) 250 MCG/5ML IJ SOLN
INTRAMUSCULAR | Status: DC | PRN
  Administered 2017-12-06: 25 ug via INTRAVENOUS
  Administered 2017-12-06: 50 ug via INTRAVENOUS
  Administered 2017-12-06: 100 ug via INTRAVENOUS
  Administered 2017-12-06: 50 ug via INTRAVENOUS

## 2017-12-06 MED ORDER — PHENYLEPHRINE 40 MCG/ML (10ML) SYRINGE FOR IV PUSH (FOR BLOOD PRESSURE SUPPORT)
PREFILLED_SYRINGE | INTRAVENOUS | Status: AC
  Filled 2017-12-06: qty 10

## 2017-12-06 MED ORDER — DIPHENHYDRAMINE HCL 50 MG/ML IJ SOLN
12.5000 mg | Freq: Four times a day (QID) | INTRAMUSCULAR | Status: DC | PRN
  Administered 2017-12-06: 12.5 mg via INTRAVENOUS
  Filled 2017-12-06: qty 1

## 2017-12-06 MED ORDER — MIDAZOLAM HCL 2 MG/2ML IJ SOLN
INTRAMUSCULAR | Status: AC
  Administered 2017-12-06: 1 mg via INTRAVENOUS
  Filled 2017-12-06: qty 2

## 2017-12-06 MED ORDER — BUPIVACAINE-EPINEPHRINE 0.25% -1:200000 IJ SOLN
INTRAMUSCULAR | Status: DC | PRN
  Administered 2017-12-06: 10 mL

## 2017-12-06 MED ORDER — PROPOFOL 10 MG/ML IV BOLUS
INTRAVENOUS | Status: DC | PRN
  Administered 2017-12-06: 200 mg via INTRAVENOUS

## 2017-12-06 MED ORDER — DEXAMETHASONE SODIUM PHOSPHATE 10 MG/ML IJ SOLN
INTRAMUSCULAR | Status: AC
  Filled 2017-12-06: qty 1

## 2017-12-06 MED ORDER — LIDOCAINE 2% (20 MG/ML) 5 ML SYRINGE
INTRAMUSCULAR | Status: AC
  Filled 2017-12-06: qty 5

## 2017-12-06 MED ORDER — ROCURONIUM BROMIDE 10 MG/ML (PF) SYRINGE
PREFILLED_SYRINGE | INTRAVENOUS | Status: DC | PRN
  Administered 2017-12-06: 10 mg via INTRAVENOUS
  Administered 2017-12-06: 50 mg via INTRAVENOUS
  Administered 2017-12-06 (×2): 10 mg via INTRAVENOUS

## 2017-12-06 MED ORDER — 0.9 % SODIUM CHLORIDE (POUR BTL) OPTIME
TOPICAL | Status: DC | PRN
  Administered 2017-12-06: 1000 mL

## 2017-12-06 MED ORDER — MIDAZOLAM HCL 5 MG/5ML IJ SOLN
INTRAMUSCULAR | Status: DC | PRN
  Administered 2017-12-06: 2 mg via INTRAVENOUS

## 2017-12-06 MED ORDER — SODIUM CHLORIDE 0.9 % IR SOLN
Status: DC | PRN
  Administered 2017-12-06: 1000 mL

## 2017-12-06 MED ORDER — PROMETHAZINE HCL 25 MG/ML IJ SOLN
INTRAMUSCULAR | Status: AC
  Administered 2017-12-06: 12.5 mg via INTRAVENOUS
  Filled 2017-12-06: qty 1

## 2017-12-06 MED ORDER — ESMOLOL HCL 100 MG/10ML IV SOLN
INTRAVENOUS | Status: AC
  Filled 2017-12-06: qty 10

## 2017-12-06 MED ORDER — EPHEDRINE SULFATE-NACL 50-0.9 MG/10ML-% IV SOSY
PREFILLED_SYRINGE | INTRAVENOUS | Status: DC | PRN
  Administered 2017-12-06 (×3): 5 mg via INTRAVENOUS

## 2017-12-06 MED ORDER — SUGAMMADEX SODIUM 500 MG/5ML IV SOLN
INTRAVENOUS | Status: AC
  Filled 2017-12-06: qty 5

## 2017-12-06 MED ORDER — FENTANYL CITRATE (PF) 100 MCG/2ML IJ SOLN
25.0000 ug | INTRAMUSCULAR | Status: DC | PRN
  Administered 2017-12-06 (×2): 50 ug via INTRAVENOUS

## 2017-12-06 MED ORDER — LACTATED RINGERS IV SOLN
INTRAVENOUS | Status: DC
  Administered 2017-12-06 (×2): via INTRAVENOUS

## 2017-12-06 MED ORDER — DEXAMETHASONE SODIUM PHOSPHATE 10 MG/ML IJ SOLN
INTRAMUSCULAR | Status: DC | PRN
  Administered 2017-12-06: 10 mg via INTRAVENOUS

## 2017-12-06 MED ORDER — PHENYLEPHRINE 40 MCG/ML (10ML) SYRINGE FOR IV PUSH (FOR BLOOD PRESSURE SUPPORT)
PREFILLED_SYRINGE | INTRAVENOUS | Status: DC | PRN
  Administered 2017-12-06 (×2): 80 ug via INTRAVENOUS

## 2017-12-06 MED ORDER — ONDANSETRON HCL 4 MG/2ML IJ SOLN
INTRAMUSCULAR | Status: DC | PRN
  Administered 2017-12-06: 4 mg via INTRAVENOUS

## 2017-12-06 MED ORDER — LIDOCAINE 2% (20 MG/ML) 5 ML SYRINGE
INTRAMUSCULAR | Status: DC | PRN
  Administered 2017-12-06: 100 mg via INTRAVENOUS

## 2017-12-06 MED ORDER — FENTANYL CITRATE (PF) 250 MCG/5ML IJ SOLN
INTRAMUSCULAR | Status: AC
  Filled 2017-12-06: qty 5

## 2017-12-06 MED ORDER — MIDAZOLAM HCL 2 MG/2ML IJ SOLN
1.0000 mg | Freq: Once | INTRAMUSCULAR | Status: AC
  Administered 2017-12-06: 1 mg via INTRAVENOUS

## 2017-12-06 MED ORDER — ROCURONIUM BROMIDE 50 MG/5ML IV SOSY
PREFILLED_SYRINGE | INTRAVENOUS | Status: AC
  Filled 2017-12-06: qty 10

## 2017-12-06 SURGICAL SUPPLY — 39 items
ADH SKN CLS APL DERMABOND .7 (GAUZE/BANDAGES/DRESSINGS) ×1
APPLIER CLIP 5 13 M/L LIGAMAX5 (MISCELLANEOUS) ×3
APR CLP MED LRG 5 ANG JAW (MISCELLANEOUS) ×1
BAG SPEC RTRVL LRG 6X4 10 (ENDOMECHANICALS) ×1
BLADE CLIPPER SURG (BLADE) IMPLANT
CANISTER SUCT 3000ML PPV (MISCELLANEOUS) ×3 IMPLANT
CHLORAPREP W/TINT 26ML (MISCELLANEOUS) ×3 IMPLANT
CLIP APPLIE 5 13 M/L LIGAMAX5 (MISCELLANEOUS) ×1 IMPLANT
COVER SURGICAL LIGHT HANDLE (MISCELLANEOUS) ×3 IMPLANT
COVER WAND RF STERILE (DRAPES) ×3 IMPLANT
DERMABOND ADVANCED (GAUZE/BANDAGES/DRESSINGS) ×2
DERMABOND ADVANCED .7 DNX12 (GAUZE/BANDAGES/DRESSINGS) ×1 IMPLANT
ELECT REM PT RETURN 9FT ADLT (ELECTROSURGICAL) ×3
ELECTRODE REM PT RTRN 9FT ADLT (ELECTROSURGICAL) ×1 IMPLANT
GLOVE BIO SURGEON STRL SZ 6 (GLOVE) ×3 IMPLANT
GLOVE INDICATOR 6.5 STRL GRN (GLOVE) ×3 IMPLANT
GOWN STRL REUS W/ TWL LRG LVL3 (GOWN DISPOSABLE) ×3 IMPLANT
GOWN STRL REUS W/TWL LRG LVL3 (GOWN DISPOSABLE) ×9
GRASPER SUT TROCAR 14GX15 (MISCELLANEOUS) ×3 IMPLANT
KIT BASIN OR (CUSTOM PROCEDURE TRAY) ×3 IMPLANT
KIT TURNOVER KIT B (KITS) ×3 IMPLANT
NDL INSUFFLATION 14GA 120MM (NEEDLE) ×1 IMPLANT
NEEDLE INSUFFLATION 14GA 120MM (NEEDLE) ×3 IMPLANT
NS IRRIG 1000ML POUR BTL (IV SOLUTION) ×3 IMPLANT
PAD ARMBOARD 7.5X6 YLW CONV (MISCELLANEOUS) ×3 IMPLANT
POUCH SPECIMEN RETRIEVAL 10MM (ENDOMECHANICALS) ×3 IMPLANT
SCISSORS LAP 5X35 DISP (ENDOMECHANICALS) ×3 IMPLANT
SET IRRIG TUBING LAPAROSCOPIC (IRRIGATION / IRRIGATOR) ×3 IMPLANT
SLEEVE ENDOPATH XCEL 5M (ENDOMECHANICALS) ×6 IMPLANT
SPECIMEN JAR SMALL (MISCELLANEOUS) ×3 IMPLANT
SUT MNCRL AB 4-0 PS2 18 (SUTURE) ×3 IMPLANT
SUT VICRYL 0 UR6 27IN ABS (SUTURE) ×2 IMPLANT
TOWEL OR 17X24 6PK STRL BLUE (TOWEL DISPOSABLE) ×3 IMPLANT
TOWEL OR 17X26 10 PK STRL BLUE (TOWEL DISPOSABLE) IMPLANT
TRAY LAPAROSCOPIC MC (CUSTOM PROCEDURE TRAY) ×3 IMPLANT
TROCAR XCEL NON-BLD 11X100MML (ENDOMECHANICALS) ×3 IMPLANT
TROCAR XCEL NON-BLD 5MMX100MML (ENDOMECHANICALS) ×3 IMPLANT
TUBING INSUFFLATION (TUBING) ×3 IMPLANT
WATER STERILE IRR 1000ML POUR (IV SOLUTION) ×3 IMPLANT

## 2017-12-06 NOTE — Anesthesia Preprocedure Evaluation (Signed)
Anesthesia Evaluation  Patient identified by MRN, date of birth, ID band Patient awake    Reviewed: Allergy & Precautions, NPO status , Patient's Chart, lab work & pertinent test results  Airway Mallampati: III  TM Distance: >3 FB Neck ROM: Full    Dental  (+) Teeth Intact, Dental Advisory Given   Pulmonary neg pulmonary ROS,    Pulmonary exam normal breath sounds clear to auscultation       Cardiovascular Exercise Tolerance: Good hypertension, Pt. on medications Normal cardiovascular exam Rhythm:Regular Rate:Normal     Neuro/Psych PSYCHIATRIC DISORDERS Anxiety Bipolar Disorder negative neurological ROS     GI/Hepatic negative GI ROS, Chronic cholecystitis, cholelithiasis   Endo/Other  Morbid obesity  Renal/GU negative Renal ROS     Musculoskeletal negative musculoskeletal ROS (+)   Abdominal   Peds  Hematology  (+) Blood dyscrasia, anemia ,   Anesthesia Other Findings Day of surgery medications reviewed with the patient.  Reproductive/Obstetrics (+) Breast feeding                              Anesthesia Physical Anesthesia Plan  ASA: III  Anesthesia Plan: General   Post-op Pain Management:    Induction: Intravenous  PONV Risk Score and Plan: 4 or greater and Midazolam, Dexamethasone, Ondansetron and Diphenhydramine  Airway Management Planned: Oral ETT  Additional Equipment:   Intra-op Plan:   Post-operative Plan: Extubation in OR  Informed Consent: I have reviewed the patients History and Physical, chart, labs and discussed the procedure including the risks, benefits and alternatives for the proposed anesthesia with the patient or authorized representative who has indicated his/her understanding and acceptance.   Dental advisory given  Plan Discussed with: CRNA  Anesthesia Plan Comments:         Anesthesia Quick Evaluation

## 2017-12-06 NOTE — H&P (View-Only) (Signed)
   Assessment & Plan: HD#2 - chronic cholecystitis, cholelithiasis, abd pain  Plan lap chole today with Dr. Connor  NPO  Pain Rx  Await OR availability later today        Victoria Wanamaker, MD       [facility]       Office: [phone]   Chief Complaint: Symptomatic cholelithiasis  Subjective: Patient in bed watching "[place]".  NPO.  Mild pain.  Awaiting surgery today.  Objective: Vital signs in last 24 hours: Temp:  [98.2 F (36.8 C)-99.2 F (37.3 C)] 98.7 F (37.1 C) (11/23 0444) Pulse Rate:  [72-87] 72 (11/23 0444) Resp:  [16-20] 20 (11/23 0444) BP: (119-125)/(64-86) 120/64 (11/23 0444) SpO2:  [99 %-100 %] 100 % (11/23 0444) Last BM Date: 12/04/17  Intake/Output from previous day: 11/22 0701 - 11/23 0700 In: 759.6 [P.O.:240; I.V.:419.6; IV Piggyback:100] Out: -  Intake/Output this shift: Total I/O In: 122 [I.V.:122] Out: -   Physical Exam: HEENT - sclerae clear, mucous membranes moist Neck - soft Chest - clear bilaterally Cor - RRR Abdomen - soft, obese; mild RUQ tenderness; no mass; no guarding Ext - no edema, non-tender Neuro - alert & oriented, no focal deficits  Lab Results:  Recent Labs    12/05/17 1306 12/06/17 0511  WBC 8.8 5.8  HGB 9.8* 8.8*  HCT 34.2* 30.5*  PLT 293 263   BMET Recent Labs    12/05/17 1306 12/06/17 0511  NA 138 138  K 3.4* 3.4*  CL 107 107  CO2 23 24  GLUCOSE 84 85  BUN 7 6  CREATININE 0.67 0.83  CALCIUM 9.1 8.7*   PT/INR No results for input(s): LABPROT, INR in the last 72 hours. Comprehensive Metabolic Panel:    Component Value Date/Time   NA 138 12/06/2017 0511   NA 138 12/05/2017 1306   K 3.4 (L) 12/06/2017 0511   K 3.4 (L) 12/05/2017 1306   CL 107 12/06/2017 0511   CL 107 12/05/2017 1306   CO2 24 12/06/2017 0511   CO2 23 12/05/2017 1306   BUN 6 12/06/2017 0511   BUN 7 12/05/2017 1306   CREATININE 0.83 12/06/2017 0511   CREATININE 0.67 12/05/2017 1306   GLUCOSE 85 12/06/2017  0511   GLUCOSE 84 12/05/2017 1306   CALCIUM 8.7 (L) 12/06/2017 0511   CALCIUM 9.1 12/05/2017 1306   AST 20 12/06/2017 0511   AST 24 12/05/2017 1306   ALT 26 12/06/2017 0511   ALT 28 12/05/2017 1306   ALKPHOS 79 12/06/2017 0511   ALKPHOS 91 12/05/2017 1306   BILITOT 0.4 12/06/2017 0511   BILITOT 0.4 12/05/2017 1306   PROT 6.3 (L) 12/06/2017 0511   PROT 7.6 12/05/2017 1306   ALBUMIN 3.3 (L) 12/06/2017 0511   ALBUMIN 3.9 12/05/2017 1306    Studies/Results: Us Abdomen Complete  Result Date: 12/05/2017 CLINICAL DATA:  Upper abdominal pain. EXAM: ABDOMEN ULTRASOUND COMPLETE COMPARISON:  CT scan August 17, 2017 FINDINGS: Gallbladder: Cholelithiasis without wall thickening or pericholecystic fluid. A Murphy's sign cannot be well assessed as the patient was given pain medications. Common bile duct: Diameter: 4.7 mm Liver: No focal mass. Portal vein is patent on color Doppler imaging with normal direction of blood flow towards the liver. IVC: No abnormality visualized. Pancreas: Visualized portion unremarkable. Spleen: Size and appearance within normal limits. Right Kidney: Length: 11.4 cm. Echogenicity within normal limits. No mass or hydronephrosis visualized. Left Kidney: Length: 11.0 cm. Echogenicity within normal limits. No mass or   hydronephrosis visualized. Abdominal aorta: No aneurysm visualized. Other findings: None. IMPRESSION: 1. Cholelithiasis without wall thickening or pericholecystic fluid. A Murphy's sign cannot be assessed as the patient was given pain medications. If there is continued clinical concern for acute cholecystitis, recommend a HIDA scan. 2. No other significant abnormalities. Electronically Signed   By: David  Williams III M.D   On: 12/05/2017 01:07   Ct Abdomen Pelvis W Contrast  Result Date: 12/05/2017 CLINICAL DATA:  Right upper quadrant pain, nausea and vomiting. EXAM: CT ABDOMEN AND PELVIS WITH CONTRAST TECHNIQUE: Multidetector CT imaging of the abdomen and pelvis was  performed using the standard protocol following bolus administration of intravenous contrast. CONTRAST:  100mL OMNIPAQUE IOHEXOL 300 MG/ML  SOLN COMPARISON:  CT 08/17/2017, right upper quadrant abdominal ultrasound 12/05/2017 FINDINGS: Lower chest: Top-normal heart size. Clear lung bases. Hepatobiliary: Gallbladder wall edema and thickening with gallstones noted, better visualized on the coronal reformats. No biliary dilatation. No hepatic mass. Pancreas: Mild peripancreatic edema may be sympathetic from acute cholecystitis or pancreatitis. No necrosis, ductal dilatation or mass. Spleen: Normal Adrenals/Urinary Tract: Normal bilateral adrenal glands, kidneys and ureters. Urinary bladder is physiologically distended without focal mural thickening or calculus. Stomach/Bowel: Gastric band device in place. No bowel obstruction or inflammation. Normal appendix. Vascular/Lymphatic: No significant vascular findings are present. No enlarged abdominal or pelvic lymph nodes. Reproductive: Uterus and bilateral adnexa are unremarkable. Other: Tiny periumbilical fat containing hernia. No free air nor free fluid. Musculoskeletal: No acute or significant osseous findings. IMPRESSION: 1. Peripancreatic edema consistent with acute pancreatitis or sympathetic secondary to adjacent cholecystitis. 2. Edematous appearing moderately distended gallbladder with faint gallstones suspicious for acute cholecystitis. Electronically Signed   By: David  Kwon M.D.   On: 12/05/2017 16:03      Mirinda Monte M 12/06/2017  Patient ID: Victoria Knight, female   DOB: 03/24/1985, 32 y.o.   MRN: [id number]  

## 2017-12-06 NOTE — Anesthesia Postprocedure Evaluation (Signed)
Anesthesia Post Note  Patient: Victoria MaiersChristina I Knight  Procedure(s) Performed: LAPAROSCOPIC CHOLECYSTECTOMY (N/A Abdomen)     Patient location during evaluation: PACU Anesthesia Type: General Level of consciousness: awake and alert, oriented and awake Pain management: pain level controlled Vital Signs Assessment: post-procedure vital signs reviewed and stable Respiratory status: spontaneous breathing, nonlabored ventilation and respiratory function stable Cardiovascular status: blood pressure returned to baseline and stable Postop Assessment: no apparent nausea or vomiting Anesthetic complications: no    Last Vitals:  Vitals:   12/06/17 1614 12/06/17 1636  BP: (!) 98/44 134/87  Pulse: 73 86  Resp: 17 17  Temp: 36.9 C 37.3 C  SpO2: 99% 96%    Last Pain:  Vitals:   12/06/17 1636  TempSrc: Oral  PainSc:                  Cecile HearingStephen Edward Craigory Toste

## 2017-12-06 NOTE — Interval H&P Note (Signed)
History and Physical Interval Note:  12/06/2017 12:00 PM  Victoria Knight  has presented today for surgery, with the diagnosis of Cholecystis  The various methods of treatment have been discussed with the patient and family. After consideration of risks, benefits and other options for treatment, the patient has consented to  Procedure(s): LAPAROSCOPIC CHOLECYSTECTOMY (N/A) as a surgical intervention .  The patient's history has been reviewed, patient examined, no change in status, stable for surgery.  I have reviewed the patient's chart and labs.  Questions were answered to the patient's satisfaction.      Lollie SailsA 

## 2017-12-06 NOTE — Op Note (Signed)
[phone] Neth0[place]20825-5River ParMaralyn SagoHospit30al2-2417C[place] Hol817 034Marykay Lex1Charlesetta Garibaldiane7yn N62etCrown [phone] Rosen53mBJackelyn PoElige Radons Jackelyn PoE3840981[facility]289-617-KentuckyMarland KitchenDaArvLogan BoreRoanna Raidernter LLCAG>23-2661Melvyn Neth1610[place]57 lige Radonackelyn Poling8mBurman NievesMyles [id number] Huston Foley45.4[facility] LLCBoone Hospital CenterZOXWR'[phone] Sprout69m h VillageZOXWR'[phone] Sprout53mPrudy FeelerWR'U63[address]Tomasita Crumble<BADTEXT ADTEXTTAG>62.9[facility]28mL40981EliCharles A Dean Memorial [phone] controlMarland Kitchen surveyor<MEASUDanellArvinMLogan BoresitorTEXTRoanna RaiderHilt 201[employer]6316Maralyn SagoChildren'S Medical Center [phone] Neth1610[place]

## 2017-12-06 NOTE — Transfer of Care (Addendum)
Immediate Anesthesia Transfer of Care Note  Patient: Victoria Knight  Procedure(s) Performed: LAPAROSCOPIC CHOLECYSTECTOMY (N/A Abdomen)  Patient Location: PACU  Anesthesia Type:General  Level of Consciousness: drowsy and patient cooperative  Airway & Oxygen Therapy: Patient Spontanous Breathing and Patient connected to nasal cannula oxygen  Post-op Assessment: Report given to RN, Post -op Vital signs reviewed and stable, Patient moving all extremities and Patient moving all extremities X 4  Post vital signs: Reviewed and stable  Last Vitals:  Vitals Value Taken Time  BP 105/88 12/06/2017  3:00 PM  Temp    Pulse 74 12/06/2017  3:04 PM  Resp 12 12/06/2017  3:04 PM  SpO2 94 % 12/06/2017  3:04 PM  Vitals shown include unvalidated device data.  Last Pain:  Vitals:   12/06/17 0900  TempSrc: Oral  PainSc: 3       Patients Stated Pain Goal: 3 (12/06/17 0727)  Complications: Patient reports history of "feeling crazy" after anesthesia and "hate waking up after anesthesia, patient appears to have emergence delirium.

## 2017-12-06 NOTE — Progress Notes (Addendum)
RN verified the presence of a signed informed consent that matches stated procedure by patient. Verified armband matches patient's stated name and birth date. Verified NPO status and that all jewelry, contact, glasses, dentures, and partials had been removed (if applicable). No belongings brought to Short Stay.

## 2017-12-06 NOTE — Anesthesia Procedure Notes (Signed)
Procedure Name: Intubation Date/Time: 12/06/2017 1:19 PM Performed by: Jearld Pies, CRNA Pre-anesthesia Checklist: Patient identified, Emergency Drugs available, Suction available and Patient being monitored Patient Re-evaluated:Patient Re-evaluated prior to induction Oxygen Delivery Method: Circle System Utilized Preoxygenation: Pre-oxygenation with 100% oxygen Induction Type: IV induction Ventilation: Mask ventilation without difficulty and Oral airway inserted - appropriate to patient size Laryngoscope Size: Mac and 3 Grade View: Grade I Tube type: Oral Tube size: 7.0 mm Number of attempts: 1 Airway Equipment and Method: Stylet and Oral airway Placement Confirmation: ETT inserted through vocal cords under direct vision,  positive ETCO2 and breath sounds checked- equal and bilateral Secured at: 21 cm Tube secured with: Tape Dental Injury: Teeth and Oropharynx as per pre-operative assessment

## 2017-12-06 NOTE — Progress Notes (Signed)
Assessment & Plan: HD#2 - chronic cholecystitis, cholelithiasis, abd pain  Plan lap chole today with Dr. Fredricka Bonine  NPO  Pain Rx  Await OR availability later today        Darnell Level, MD       [facility]       Office: [phone]   Chief Complaint: Symptomatic cholelithiasis  Subjective: Patient in bed watching "[place]".  NPO.  Mild pain.  Awaiting surgery today.  Objective: Vital signs in last 24 hours: Temp:  [98.2 F (36.8 C)-99.2 F (37.3 C)] 98.7 F (37.1 C) (11/23 0444) Pulse Rate:  [72-87] 72 (11/23 0444) Resp:  [16-20] 20 (11/23 0444) BP: (119-125)/(64-86) 120/64 (11/23 0444) SpO2:  [99 %-100 %] 100 % (11/23 0444) Last BM Date: 12/04/17  Intake/Output from previous day: 11/22 0701 - 11/23 0700 In: 759.6 [P.O.:240; I.V.:419.6; IV Piggyback:100] Out: -  Intake/Output this shift: Total I/O In: 122 [I.V.:122] Out: -   Physical Exam: HEENT - sclerae clear, mucous membranes moist Neck - soft Chest - clear bilaterally Cor - RRR Abdomen - soft, obese; mild RUQ tenderness; no mass; no guarding Ext - no edema, non-tender Neuro - alert & oriented, no focal deficits  Lab Results:  Recent Labs    12/05/17 1306 12/06/17 0511  WBC 8.8 5.8  HGB 9.8* 8.8*  HCT 34.2* 30.5*  PLT 293 263   BMET Recent Labs    12/05/17 1306 12/06/17 0511  NA 138 138  K 3.4* 3.4*  CL 107 107  CO2 23 24  GLUCOSE 84 85  BUN 7 6  CREATININE 0.67 0.83  CALCIUM 9.1 8.7*   PT/INR No results for input(s): LABPROT, INR in the last 72 hours. Comprehensive Metabolic Panel:    Component Value Date/Time   NA 138 12/06/2017 0511   NA 138 12/05/2017 1306   K 3.4 (L) 12/06/2017 0511   K 3.4 (L) 12/05/2017 1306   CL 107 12/06/2017 0511   CL 107 12/05/2017 1306   CO2 24 12/06/2017 0511   CO2 23 12/05/2017 1306   BUN 6 12/06/2017 0511   BUN 7 12/05/2017 1306   CREATININE 0.83 12/06/2017 0511   CREATININE 0.67 12/05/2017 1306   GLUCOSE 85 12/06/2017  0511   GLUCOSE 84 12/05/2017 1306   CALCIUM 8.7 (L) 12/06/2017 0511   CALCIUM 9.1 12/05/2017 1306   AST 20 12/06/2017 0511   AST 24 12/05/2017 1306   ALT 26 12/06/2017 0511   ALT 28 12/05/2017 1306   ALKPHOS 79 12/06/2017 0511   ALKPHOS 91 12/05/2017 1306   BILITOT 0.4 12/06/2017 0511   BILITOT 0.4 12/05/2017 1306   PROT 6.3 (L) 12/06/2017 0511   PROT 7.6 12/05/2017 1306   ALBUMIN 3.3 (L) 12/06/2017 0511   ALBUMIN 3.9 12/05/2017 1306    Studies/Results: US Abdomen Complete  Result Date: 12/05/2017 CLINICAL DATA:  Upper abdominal pain. EXAM: ABDOMEN ULTRASOUND COMPLETE COMPARISON:  CT scan August 17, 2017 FINDINGS: Gallbladder: Cholelithiasis without wall thickening or pericholecystic fluid. A Murphy's sign cannot be well assessed as the patient was given pain medications. Common bile duct: Diameter: 4.7 mm Liver: No focal mass. Portal vein is patent on color Doppler imaging with normal direction of blood flow towards the liver. IVC: No abnormality visualized. Pancreas: Visualized portion unremarkable. Spleen: Size and appearance within normal limits. Right Kidney: Length: 11.4 cm. Echogenicity within normal limits. No mass or hydronephrosis visualized. Left Kidney: Length: 11.0 cm. Echogenicity within normal limits. No mass or  hydronephrosis visualized. Abdominal aorta: No aneurysm visualized. Other findings: None. IMPRESSION: 1. Cholelithiasis without wall thickening or pericholecystic fluid. A Murphy's sign cannot be assessed as the patient was given pain medications. If there is continued clinical concern for acute cholecystitis, recommend a HIDA scan. 2. No other significant abnormalities. Electronically Signed   By: Gerome Samavid  Williams III M.D   On: 12/05/2017 01:07   Ct Abdomen Pelvis W Contrast  Result Date: 12/05/2017 CLINICAL DATA:  Right upper quadrant pain, nausea and vomiting. EXAM: CT ABDOMEN AND PELVIS WITH CONTRAST TECHNIQUE: Multidetector CT imaging of the abdomen and pelvis was  performed using the standard protocol following bolus administration of intravenous contrast. CONTRAST:  100mL OMNIPAQUE IOHEXOL 300 MG/ML  SOLN COMPARISON:  CT 08/17/2017, right upper quadrant abdominal ultrasound 12/05/2017 FINDINGS: Lower chest: Top-normal heart size. Clear lung bases. Hepatobiliary: Gallbladder wall edema and thickening with gallstones noted, better visualized on the coronal reformats. No biliary dilatation. No hepatic mass. Pancreas: Mild peripancreatic edema may be sympathetic from acute cholecystitis or pancreatitis. No necrosis, ductal dilatation or mass. Spleen: Normal Adrenals/Urinary Tract: Normal bilateral adrenal glands, kidneys and ureters. Urinary bladder is physiologically distended without focal mural thickening or calculus. Stomach/Bowel: Gastric band device in place. No bowel obstruction or inflammation. Normal appendix. Vascular/Lymphatic: No significant vascular findings are present. No enlarged abdominal or pelvic lymph nodes. Reproductive: Uterus and bilateral adnexa are unremarkable. Other: Tiny periumbilical fat containing hernia. No free air nor free fluid. Musculoskeletal: No acute or significant osseous findings. IMPRESSION: 1. Peripancreatic edema consistent with acute pancreatitis or sympathetic secondary to adjacent cholecystitis. 2. Edematous appearing moderately distended gallbladder with faint gallstones suspicious for acute cholecystitis. Electronically Signed   By: Tollie Ethavid  Kwon M.D.   On: 12/05/2017 16:03      Victoria Knight M 12/06/2017  Patient ID: Victoria Knight, female   DOB: 1985/03/09, 32 y.o.   MRN: [id number]

## 2017-12-07 ENCOUNTER — Encounter (HOSPITAL_COMMUNITY): Payer: Self-pay | Admitting: Surgery

## 2017-12-07 MED ORDER — OXYCODONE HCL 5 MG PO TABS: 5.0000 mg | ORAL_TABLET | Freq: Four times a day (QID) | ORAL | 0 refills | Status: DC | PRN

## 2017-12-07 NOTE — Plan of Care (Signed)
  Problem: Education: Goal: Knowledge of General Education information will improve Description: Including pain rating scale, medication(s)/side effects and non-pharmacologic comfort measures Outcome: Progressing   Problem: Activity: Goal: Risk for activity intolerance will decrease Outcome: Progressing   Problem: Nutrition: Goal: Adequate nutrition will be maintained Outcome: Progressing   

## 2017-12-07 NOTE — Discharge Summary (Signed)
Physician Discharge Summary [facility]  Patient ID: Victoria MaiersChristina I Knight MRN: [id number] DOB/AGE: 1985-03-12 32 y.o.  Admit date: 12/05/2017 Discharge date: 12/07/2017  Admission Diagnoses:  Chronic cholecystitis, cholelithiasis, biliary colic  Discharge Diagnoses:  Active Problems:   Cholecystitis   Discharged Condition: good  Hospital Course: Patient was admitted for observation following gallbladder surgery.  Post op course was uncomplicated.  Pain was well controlled.  Tolerated diet.  Patient was prepared for discharge home on POD#1.  Consults: None  Treatments: surgery: lap cholecystectomy (Dr. Fredricka Bonineonnor)  Discharge Exam: Blood pressure (!) 102/57, pulse (!) 59, temperature 98.8 F (37.1 C), temperature source Oral, resp. rate 20, SpO2 99 %, unknown if currently breastfeeding. HEENT - clear Neck - soft Chest - clear bilaterally Cor - RRR Abd - soft without distension; wounds dry and intact with Dermabond  Disposition: Home  Discharge Instructions    Diet - low sodium heart healthy   Complete by:  As directed    Discharge instructions   Complete by:  As directed    [facility]  LAPAROSCOPIC SURGERY:  POST-OP INSTRUCTIONS  Always review your discharge instruction sheet given to you by the facility where your surgery was performed.  A prescription for pain medication may be given to you upon discharge.  Take your pain medication as prescribed.  If narcotic pain medicine is not needed, then you may take acetaminophen (Tylenol) or ibuprofen (Advil) as needed.  Take your usually prescribed medications unless otherwise directed.  If you need a refill on your pain medication, please contact your pharmacy.  They will contact our office to request authorization. Prescriptions will not be filled after 5 P.M. or on weekends.  You should follow a light diet the first few days after arrival home, such as soup and crackers or toast.  Be  sure to include plenty of fluids daily.  Most patients will experience some swelling and bruising in the area of the incisions.  Ice packs will help.  Swelling and bruising can take several days to resolve.   It is common to experience some constipation after surgery.  Increasing fluid intake and taking a stool softener (such as Colace) will usually help or prevent this problem from occurring.  A mild laxative (Milk of Magnesia or Miralax) should be taken according to package instructions if there has been no bowel movement after 48 hours.  You will have steri-strips and a gauze dressing over your incisions.  You may remove the gauze bandage on the second day after surgery, and you may shower at that time.  Leave your steri-strips (small skin tapes) in place directly over the incision.  These strips should remain on the skin for 5-7 days and then be removed.  You may get them wet in the shower and pat them dry.  Any sutures or staples will be removed at the office during your follow-up visit.  ACTIVITIES:  You may resume regular (light) daily activities beginning the next day - such as daily self-care, walking, climbing stairs - gradually increasing activities as tolerated.  You may have sexual intercourse when it is comfortable.  Refrain from any heavy lifting or straining until approved by your doctor.  You may drive when you are no longer taking prescription pain medication, you can comfortably wear a seatbelt, and you can safely maneuver your car and apply brakes.  You should see your doctor in the office for a follow-up appointment approximately 2-3 weeks after your  surgery.  Make sure that you call for this appointment within a day or two after you arrive home to insure a convenient appointment time.  WHEN TO CALL YOUR DOCTOR: Fever over 101.0 Inability to urinate Continued bleeding from incision Increased pain, redness, or drainage from the incision Increasing abdominal pain  The clinic  staff is available to answer your questions during regular business hours.  Please don't hesitate to call and ask to speak to one of the nurses for clinical concerns.  If you have a medical emergency, go to the nearest emergency room or call 911.  A surgeon from [facility] is always on call for the hospital.  Velora Heckler, MD, [facility] Office: [phone] Toll Free:  [phone] FAX [phone]  Website: [email or web address]   Increase activity slowly   Complete by:  As directed    No dressing needed   Complete by:  As directed        Velora Heckler, MD, [facility] Office: [phone]   Signed: Velora Heckler 12/07/2017, 8:25 AM

## 2018-10-14 ENCOUNTER — Other Ambulatory Visit: Payer: Self-pay

## 2018-10-14 DIAGNOSIS — Z20822 Contact with and (suspected) exposure to covid-19: Secondary | ICD-10-CM

## 2018-10-15 LAB — NOVEL CORONAVIRUS, NAA: SARS-CoV-2, NAA: NOT DETECTED

## 2019-06-11 ENCOUNTER — Other Ambulatory Visit: Payer: Self-pay | Admitting: Obstetrics and Gynecology

## 2019-06-11 DIAGNOSIS — N644 Mastodynia: Secondary | ICD-10-CM

## 2019-07-02 ENCOUNTER — Ambulatory Visit
Admission: RE | Admit: 2019-07-02 | Discharge: 2019-07-02 | Disposition: A | Discharge status: Home / Self Care | Payer: BC Managed Care – PPO | Source: Ambulatory Visit | Attending: Obstetrics and Gynecology | Admitting: Obstetrics and Gynecology

## 2019-07-02 ENCOUNTER — Other Ambulatory Visit: Payer: Self-pay

## 2019-07-02 DIAGNOSIS — N644 Mastodynia: Secondary | ICD-10-CM

## 2020-03-09 ENCOUNTER — Encounter (INDEPENDENT_AMBULATORY_CARE_PROVIDER_SITE_OTHER): Payer: BC Managed Care – PPO | Admitting: Diagnostic Neuroimaging

## 2020-03-09 ENCOUNTER — Encounter: Payer: BC Managed Care – PPO | Admitting: Diagnostic Neuroimaging

## 2020-03-09 ENCOUNTER — Ambulatory Visit: Payer: BC Managed Care – PPO | Admitting: Diagnostic Neuroimaging

## 2020-03-09 DIAGNOSIS — R202 Paresthesia of skin: Secondary | ICD-10-CM

## 2020-03-09 DIAGNOSIS — R200 Anesthesia of skin: Secondary | ICD-10-CM | POA: Diagnosis not present

## 2020-03-09 DIAGNOSIS — Z0289 Encounter for other administrative examinations: Secondary | ICD-10-CM

## 2020-03-09 NOTE — Procedures (Signed)
   [facility]  NCS (NERVE CONDUCTION STUDY) WITH EMG (ELECTROMYOGRAPHY) REPORT   STUDY DATE: 03/09/20 PATIENT NAME: Victoria Knight DOB: [birth year] MRN: [id number]  ORDERING CLINICIAN: Salvadore Dom, MD  TECHNOLOGIST: Durenda Age ELECTROMYOGRAPHER: Glenford Bayley. Jisele Price, MD  CLINICAL INFORMATION: 35 year old female with right hand numbness.  FINDINGS: NERVE CONDUCTION STUDY:  Right median and right ulnar motor responses are normal.  Right median sensory response is slightly prolonged peak latency and normal amplitude.  Right ulnar sensory response is normal.  Right ulnar F-wave latency is normal.   NEEDLE ELECTROMYOGRAPHY:  Needle examination of right upper extremity is normal.   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   INTERPRETING PHYSICIAN:  Suanne Marker, MD Certified in Neurology, Neurophysiology and Neuroimaging  [facility] [address], [address] [place], [place] [address] [phone]   [facility]    Nerve / Sites Muscle Latency Ref. Amplitude Ref. Rel Amp Segments Distance Velocity Ref. Area    ms ms mV mV %  cm m/s m/s mVms  R Median - APB     Wrist APB 3.8 ?4.4 10.9 ?4.0 100 Wrist - APB 7   28.1     Upper arm APB 7.4  11.2  103 Upper arm - Wrist 21 58 ?49 29.5  R Ulnar - ADM     Wrist ADM 2.3 ?3.3 9.4 ?6.0 100 Wrist - ADM 7   26.4     B.Elbow ADM 5.2  8.6  91.6 B.Elbow - Wrist 18 61 ?49 26.5     A.Elbow ADM 6.9  7.8  91.3 A.Elbow - B.Elbow 10 60 ?49 24.3         SNC    Nerve / Sites Rec. Site Peak Lat Ref.  Amp Ref. Segments Distance    ms ms V V  cm  R Median - Orthodromic (Dig II, Mid palm)     Dig II Wrist 3.6 ?3.4 17 ?10 Dig II - Wrist 13  R Ulnar - Orthodromic, (Dig V, Mid palm)     Dig V Wrist 2.2 ?3.1 10 ?5 Dig V - Wrist 63         F  Wave    Nerve F Lat Ref.   ms ms  R Ulnar - ADM 24.2 ?32.0       EMG  Summary Table    Spontaneous MUAP Recruitment  Muscle IA Fib PSW Fasc Other Amp Dur. Poly Pattern  R. Deltoid Normal None None None _______ Normal Normal Normal Normal  R. Biceps brachii Normal None None None _______ Normal Normal Normal Normal  R. Triceps brachii Normal None None None _______ Normal Normal Normal Normal  R. Flexor carpi radialis Normal None None None _______ Normal Normal Normal Normal  R. First dorsal interosseous Normal None None None _______ Normal Normal Normal Normal

## 2020-07-31 ENCOUNTER — Other Ambulatory Visit: Payer: Self-pay | Admitting: Obstetrics & Gynecology

## 2020-07-31 DIAGNOSIS — Z1231 Encounter for screening mammogram for malignant neoplasm of breast: Secondary | ICD-10-CM

## 2020-08-04 ENCOUNTER — Other Ambulatory Visit: Payer: Self-pay

## 2020-08-04 ENCOUNTER — Ambulatory Visit
Admission: RE | Admit: 2020-08-04 | Discharge: 2020-08-04 | Disposition: A | Discharge status: Home / Self Care | Payer: BC Managed Care – PPO | Source: Ambulatory Visit | Attending: Obstetrics & Gynecology | Admitting: Obstetrics & Gynecology

## 2020-08-04 DIAGNOSIS — Z1231 Encounter for screening mammogram for malignant neoplasm of breast: Secondary | ICD-10-CM

## 2022-07-01 ENCOUNTER — Ambulatory Visit
Admission: RE | Admit: 2022-07-01 | Discharge: 2022-07-01 | Disposition: A | Discharge status: Home / Self Care | Payer: BC Managed Care – PPO | Source: Ambulatory Visit | Attending: Urgent Care | Admitting: Urgent Care

## 2022-07-01 VITALS — BP 118/83 | HR 76 | Temp 99.2°F | Resp 19

## 2022-07-01 DIAGNOSIS — Z1152 Encounter for screening for COVID-19: Secondary | ICD-10-CM | POA: Diagnosis not present

## 2022-07-01 DIAGNOSIS — R0602 Shortness of breath: Secondary | ICD-10-CM | POA: Diagnosis not present

## 2022-07-01 DIAGNOSIS — B349 Viral infection, unspecified: Secondary | ICD-10-CM | POA: Diagnosis not present

## 2022-07-01 DIAGNOSIS — Z20818 Contact with and (suspected) exposure to other bacterial communicable diseases: Secondary | ICD-10-CM | POA: Diagnosis not present

## 2022-07-01 DIAGNOSIS — J029 Acute pharyngitis, unspecified: Secondary | ICD-10-CM | POA: Diagnosis present

## 2022-07-01 LAB — POCT RAPID STREP A (OFFICE): Rapid Strep A Screen: NEGATIVE

## 2022-07-01 MED ORDER — PSEUDOEPHEDRINE HCL 30 MG PO TABS: 30.0000 mg | ORAL_TABLET | Freq: Three times a day (TID) | ORAL | 0 refills | Status: DC | PRN

## 2022-07-01 MED ORDER — CETIRIZINE HCL 10 MG PO TABS: 10.0000 mg | ORAL_TABLET | Freq: Every day | ORAL | 0 refills | Status: AC

## 2022-07-01 MED ORDER — IBUPROFEN 600 MG PO TABS: 600.0000 mg | ORAL_TABLET | Freq: Four times a day (QID) | ORAL | 0 refills | Status: DC | PRN

## 2022-07-01 NOTE — ED Triage Notes (Signed)
Pt presents with c/o sore throat, fatigue, chills and bodyaches X 2 days. Yesterday she started feeling congested, has taken tylenol.

## 2022-07-01 NOTE — Discharge Instructions (Signed)
We will notify you of your test results as they arrive and may take between about 24 hours.  I encourage you to sign up for MyChart if you have not already done so as this can be the easiest way for [place] to communicate results to you online or through a phone app.  Generally, we only contact you if it is a positive test result.  In the meantime, if you develop worsening symptoms including fever, chest pain, shortness of breath despite our current treatment plan then please report to the emergency room as this may be a sign of worsening status from possible viral infection.  Otherwise, we will manage this as a viral syndrome. For sore throat or cough try using a honey-based tea. Use 3 teaspoons of honey with juice squeezed from half lemon. Place shaved pieces of ginger into 1/2-1 cup of water and warm over stove top. Then mix the ingredients and repeat every 4 hours as needed. Please take Ibuprofen 600mg  every 6 hours for aches and pains, fevers. Hydrate very well with at least 2 liters of water. Eat light meals such as soups to replenish electrolytes and soft fruits, veggies. Start an antihistamine like Zyrtec (10mg  daily) for postnasal drainage, sinus congestion.  You can take this together with pseudoephedrine (Sudafed) at a dose of 30 mg 2-3 times a day as needed for the same kind of congestion.  Use the cough medications as needed.

## 2022-07-01 NOTE — ED Provider Notes (Signed)
[facility]  Note:  This document was prepared using Conservation officer, historic buildings and may include unintentional dictation errors.  MRN: [id number] DOB: [birth year]  Subjective:   Victoria Knight is a 37 y.o. female presenting for 2-day history of throat pain, painful swallowing, chills, fatigue, body aches, started having congestion yesterday.  Her son is currently getting treated for strep.  She has been taking Tylenol for her symptoms.  She does have a history of shortness of breath but no official diagnosis of asthma.  She was concerned that yesterday she also felt short of breath.  No smoking of any kind including cigarettes, cigars, vaping, marijuana use.    No current facility-administered medications for this encounter.  Current Outpatient Medications:    cholecalciferol (VITAMIN D3) 25 MCG (1000 UT) tablet, Take 1,000 Units by mouth daily., Disp: , Rfl:    ferrous sulfate 325 (65 FE) MG tablet, Take 325 mg by mouth daily with breakfast., Disp: , Rfl:    hydrochlorothiazide (MICROZIDE) 12.5 MG capsule, Take 1 capsule (12.5 mg total) by mouth daily. (Patient not taking: Reported on 12/06/2017), Disp: 3 capsule, Rfl: 0   ibuprofen (ADVIL,MOTRIN) 800 MG tablet, Take 1 tablet (800 mg total) by mouth every 8 (eight) hours as needed. (Patient taking differently: Take 800 mg by mouth every 8 (eight) hours as needed for moderate pain. ), Disp: 30 tablet, Rfl: 0   magnesium oxide (MAG-OX) 400 (241.3 Mg) MG tablet, Take 1 tablet (400 mg total) by mouth daily. (Patient not taking: Reported on 12/06/2017), Disp: 30 tablet, Rfl: 2   norethindrone (MICRONOR,CAMILA,ERRIN) 0.35 MG tablet, Take 1 tablet by mouth daily., Disp: , Rfl: 9   oxyCODONE (OXY IR/ROXICODONE) 5 MG immediate release tablet, Take 1-2 tablets (5-10 mg total) by mouth every 6 (six) hours as needed for moderate pain., Disp: 20 tablet, Rfl: 0   Prenatal Vit-Fe Fumarate-FA (PRENATAL MULTIVITAMIN) TABS tablet,  Take 1 tablet by mouth daily at 12 noon., Disp: , Rfl:    No Known Allergies  Past Medical History:  Diagnosis Date   Anemia    Contact lens/glasses fitting    Iron deficiency anemia 10/27/2011   Normal labor 10/26/2011   Postpartum care following cesarean delivery 10/28/2011   PROM (premature rupture of membranes) 10/26/2011   S/P cesarean section (arrest of dialation, 10/13) 10/28/2011   SOB (shortness of breath)      Past Surgical History:  Procedure Laterality Date   CESAREAN SECTION  10/27/2011   Procedure: CESAREAN SECTION;  Surgeon: Genia Del, MD;  Location: [facility];  Service: Obstetrics;  Laterality: N/A;  Primary cesarean section with delivery of baby boy at 23. Apgars  9/9.   CESAREAN SECTION N/A 08/11/2017   Procedure: CESAREAN SECTION;  Surgeon: Shea Evans, MD;  Location: [facility];  Service: Obstetrics;  Laterality: N/A;   CHOLECYSTECTOMY N/A 12/06/2017   Procedure: LAPAROSCOPIC CHOLECYSTECTOMY;  Surgeon: Berna Bue, MD;  Location: [facility] OR;  Service: General;  Laterality: N/A;   LAPAROSCOPIC GASTRIC BANDING  09/13/08   OVARIAN CYST REMOVAL Left 2018    Family History  Problem Relation Age of Onset   Breast cancer Mother 59   Heart disease Father        congestive heart failure   Hypertension Father    Arthritis Father    Diabetes Father    Gout Father     Social History   Tobacco Use   Smoking status: Never   Smokeless tobacco: Never  Substance Use Topics   Alcohol use: No   Drug use: No    ROS   Objective:   Vitals: BP 118/83   Pulse 76   Temp 99.2 F (37.3 C) (Oral)   Resp 19   LMP 06/19/2022   SpO2 99%   Physical Exam Constitutional:      General: She is not in acute distress.    Appearance: Normal appearance. She is well-developed. She is not ill-appearing, toxic-appearing or diaphoretic.  HENT:     Head: Normocephalic and atraumatic.     Right Ear: External ear normal.     Left Ear: External ear normal.      Nose: Congestion present. No rhinorrhea.     Mouth/Throat:     Mouth: Mucous membranes are moist.     Pharynx: Uvula swelling (erythema) present. No pharyngeal swelling, oropharyngeal exudate or posterior oropharyngeal erythema.     Tonsils: No tonsillar exudate or tonsillar abscesses. 0 on the right. 0 on the left.  Eyes:     General: No scleral icterus.       Right eye: No discharge.        Left eye: No discharge.     Extraocular Movements: Extraocular movements intact.  Cardiovascular:     Rate and Rhythm: Normal rate and regular rhythm.     Heart sounds: Normal heart sounds. No murmur heard.    No friction rub. No gallop.  Pulmonary:     Effort: Pulmonary effort is normal. No respiratory distress.     Breath sounds: No stridor. No wheezing, rhonchi or rales.  Chest:     Chest wall: No tenderness.  Skin:    General: Skin is warm and dry.  Neurological:     General: No focal deficit present.     Mental Status: She is alert and oriented to person, place, and time.  Psychiatric:        Mood and Affect: Mood normal.        Behavior: Behavior normal.    Results for orders placed or performed during the hospital encounter of 07/01/22 (from the past 24 hour(s))  POCT rapid strep A     Status: None   Collection Time: 07/01/22  3:38 PM  Result Value Ref Range   Rapid Strep A Screen Negative Negative    Assessment and Plan :   PDMP not reviewed this encounter.  1. Acute viral syndrome   2. Strep throat exposure    Will manage for viral illness such as viral URI, viral syndrome, viral rhinitis, COVID-19, viral pharyngitis. Recommended supportive care. Offered scripts for symptomatic relief. COVID 19 and strep culture are pending.  Patient declined empiric treatment for strep despite her exposure.  Recommended basing treatment off of the throat culture.  Deferred imaging given clear cardiopulmonary exam, hemodynamically stable vital signs.  Counseled patient on potential for  adverse effects with medications prescribed/recommended today, ER and return-to-clinic precautions discussed, patient verbalized understanding.     Wallis Bamberg, [place] 07/01/22 1546

## 2022-07-02 LAB — SARS CORONAVIRUS 2 (TAT 6-24 HRS): SARS Coronavirus 2: NEGATIVE

## 2022-07-03 LAB — CULTURE, GROUP A STREP (THRC)

## 2022-07-04 LAB — CULTURE, GROUP A STREP (THRC)

## 2023-07-01 IMAGING — MG MM DIGITAL SCREENING BILAT W/ TOMO AND CAD
8 series · 8 of 24 positions shown · non-contrast
Comparison: Previous exam(s).

ACR Breast Density Category a: The breast tissue is almost entirely
fatty.

CLINICAL DATA: Screening.

EXAM:
DIGITAL SCREENING BILATERAL MAMMOGRAM WITH TOMOSYNTHESIS AND CAD
TECHNIQUE: Bilateral screening digital craniocaudal and mediolateral oblique
mammograms were obtained. Bilateral screening digital breast
tomosynthesis was performed. The images were evaluated with
computer-aided detection.

[L MLO synth-2D]
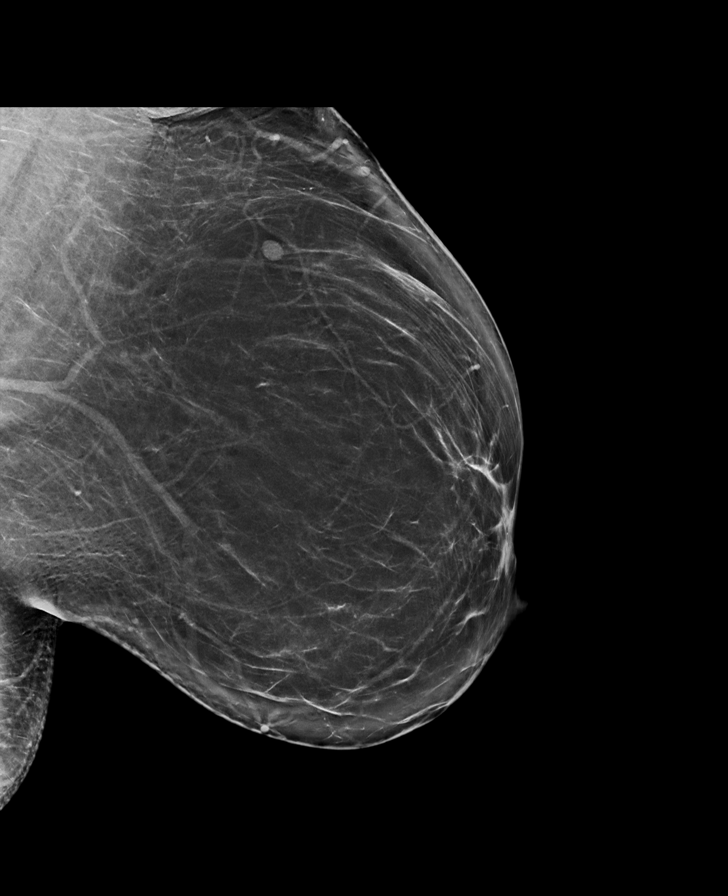

[R MLO synth-2D]
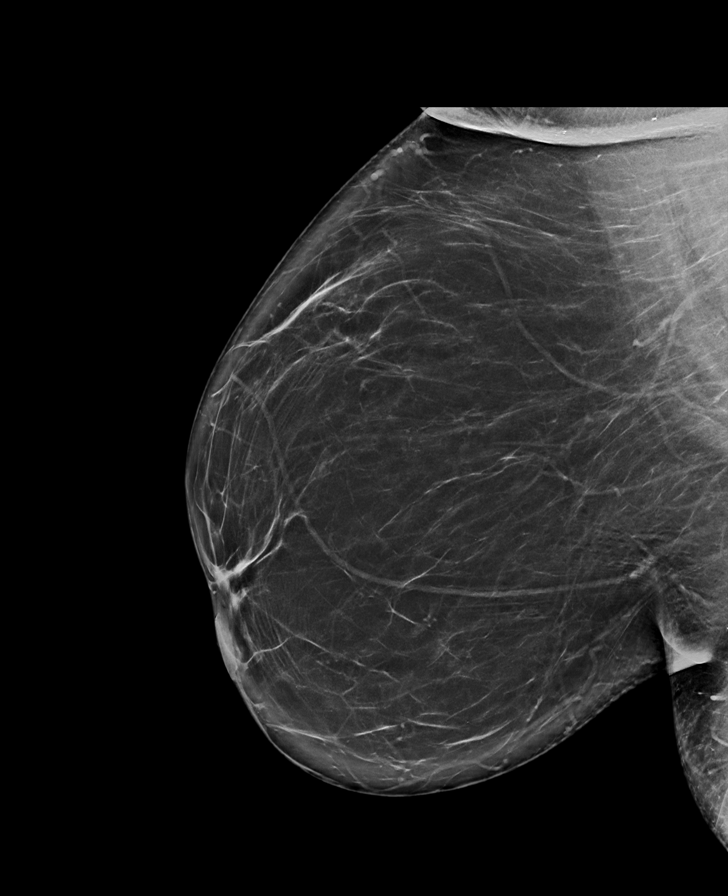

[R CC synth-2D]
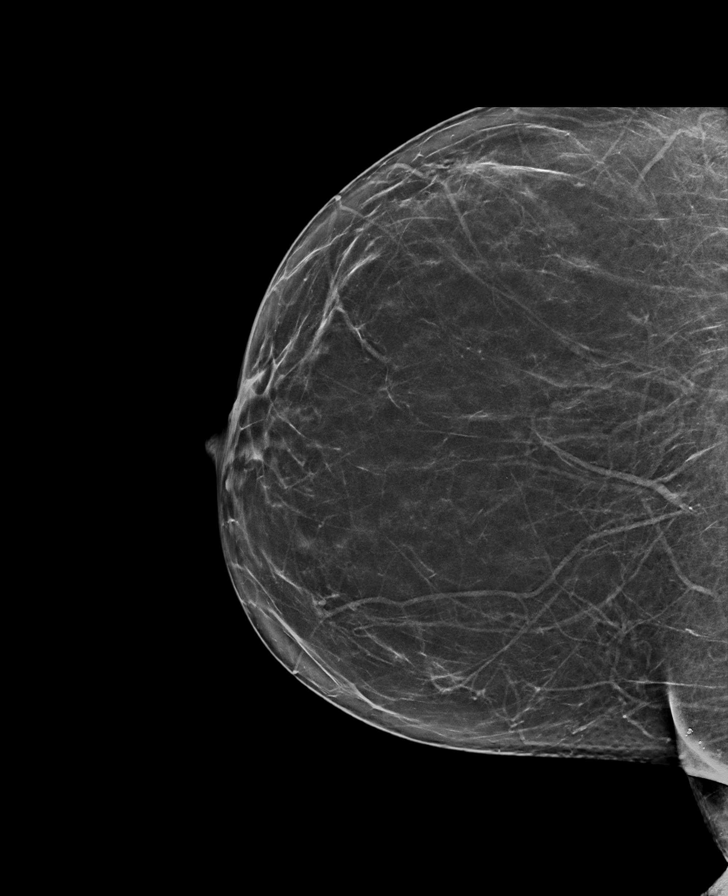

[L CC synth-2D]
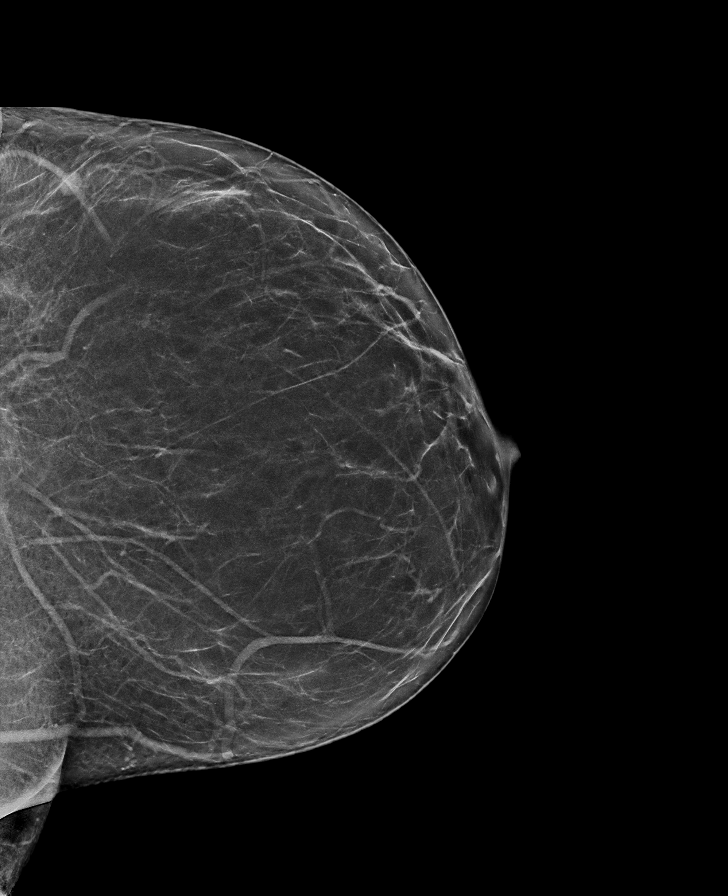

[R MLO tomo · tomo slice 47/92.0]
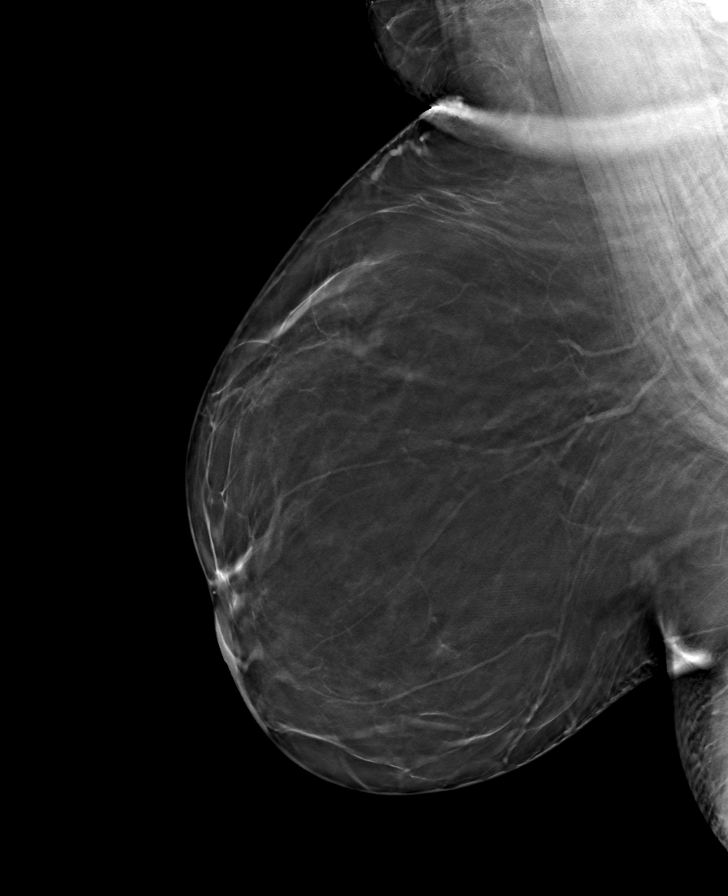

[L MLO tomo · tomo slice 48/95.0]
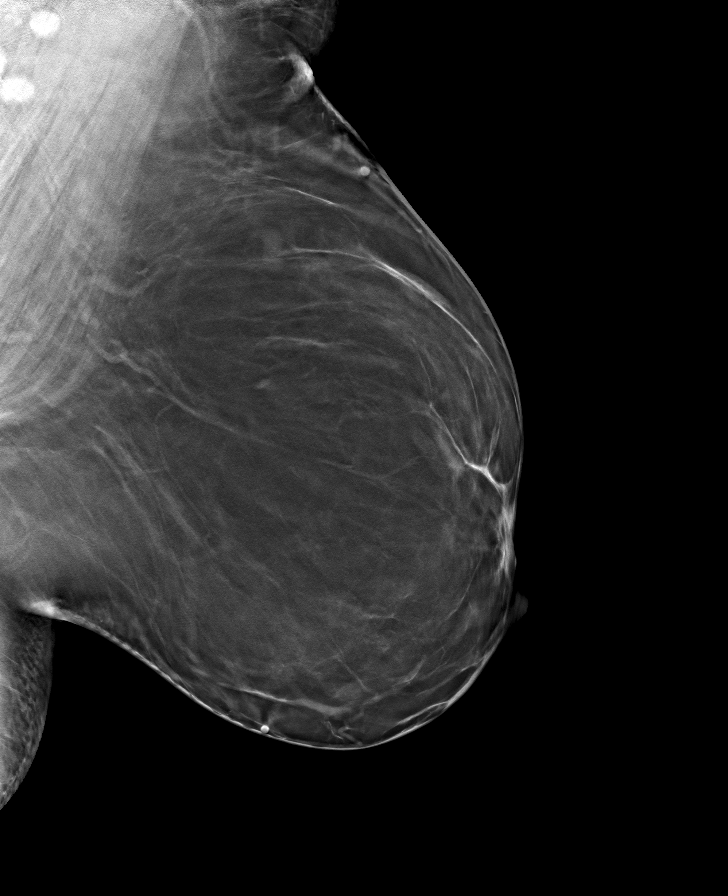

[L CC tomo · tomo slice 37/74.0]
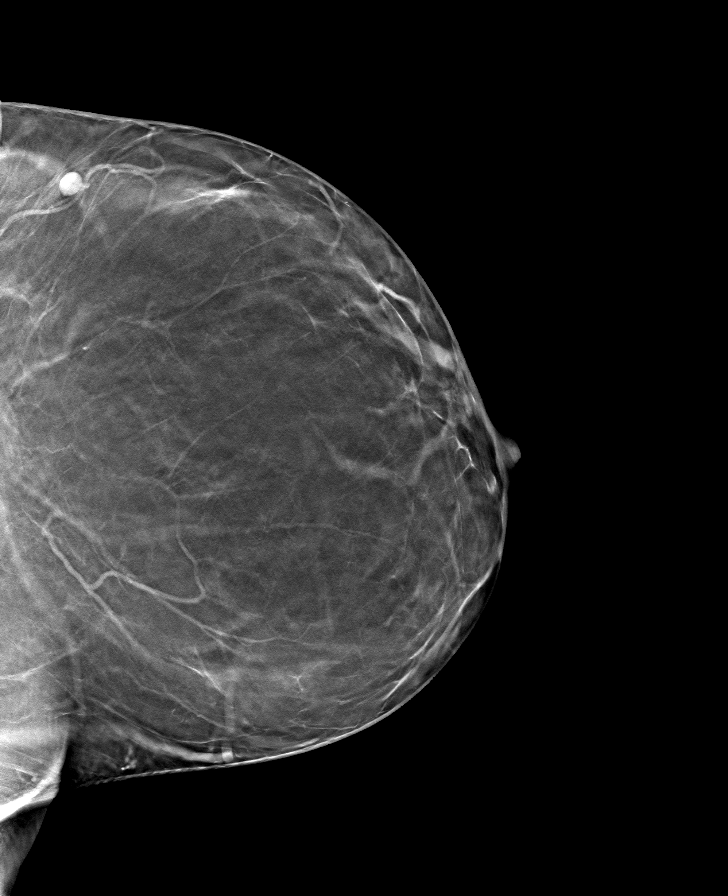

[R CC tomo · tomo slice 38/75.0]
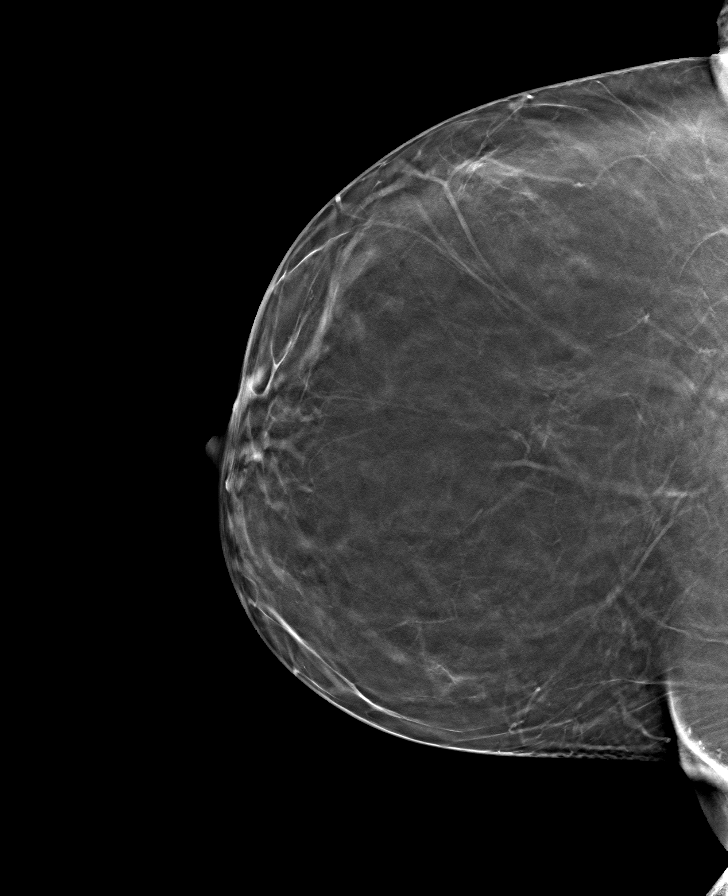

[8 of 24 positions shown; findings below may reference images not displayed]

FINDINGS: There are no findings suspicious for malignancy.
IMPRESSION: No mammographic evidence of malignancy. A result letter of this
screening mammogram will be mailed directly to the patient.

RECOMMENDATION:
Screening mammogram at age 40. (Code:3D-G-E27)

BI-RADS CATEGORY  1: Negative.

## 2023-12-08 ENCOUNTER — Other Ambulatory Visit: Payer: Self-pay | Admitting: Obstetrics & Gynecology

## 2024-02-10 ENCOUNTER — Other Ambulatory Visit: Payer: Self-pay

## 2024-02-10 ENCOUNTER — Encounter (HOSPITAL_COMMUNITY): Payer: Self-pay | Admitting: Obstetrics & Gynecology

## 2024-02-10 NOTE — Progress Notes (Signed)
 SDW CALL  Patient was given pre-op instructions over the phone. The opportunity was given for the patient to ask questions. No further questions asked. Patient verbalized understanding of instructions given.   Date & Arrival time- February 11, 2024 @ 10:30  PCP - Gerome Brunet, DO  Cardiologist -   PPM/ICD - denies Device Orders - n/a Rep Notified - n/a  Chest x-ray - denies EKG - denies Stress Test - denies ECHO - denies Cardiac Cath - denies  Sleep Study - denies CPAP - n/a  DM -denies  Blood Thinner Instructions:Denies Aspirin Instructions:denies  ERAS Protcol - Clear liquids until 10:00 am.   COVID TEST- n/a   Anesthesia review: NO  Patient denies shortness of breath, fever, cough and chest pain over the phone call   All instructions explained to the patient, with a verbal understanding of the material. Patient agrees to go over the instructions while at home for a better understanding.

## 2024-02-11 ENCOUNTER — Ambulatory Visit (HOSPITAL_COMMUNITY): Admitting: Anesthesiology

## 2024-02-11 ENCOUNTER — Encounter (HOSPITAL_COMMUNITY)
Admission: RE | Disposition: A | Discharge status: Home / Self Care | Payer: Self-pay | Source: Home / Self Care | Attending: Obstetrics & Gynecology

## 2024-02-11 ENCOUNTER — Other Ambulatory Visit: Payer: Self-pay

## 2024-02-11 ENCOUNTER — Encounter (HOSPITAL_COMMUNITY): Payer: Self-pay | Admitting: Obstetrics & Gynecology

## 2024-02-11 ENCOUNTER — Ambulatory Visit (HOSPITAL_COMMUNITY)
Admission: RE | Admit: 2024-02-11 | Discharge: 2024-02-11 | Disposition: A | Discharge status: Home / Self Care | Payer: Self-pay | Attending: Obstetrics & Gynecology | Admitting: Obstetrics & Gynecology

## 2024-02-11 DIAGNOSIS — K219 Gastro-esophageal reflux disease without esophagitis: Secondary | ICD-10-CM | POA: Insufficient documentation

## 2024-02-11 DIAGNOSIS — N83201 Unspecified ovarian cyst, right side: Secondary | ICD-10-CM

## 2024-02-11 DIAGNOSIS — N921 Excessive and frequent menstruation with irregular cycle: Secondary | ICD-10-CM | POA: Diagnosis present

## 2024-02-11 DIAGNOSIS — Z302 Encounter for sterilization: Secondary | ICD-10-CM | POA: Insufficient documentation

## 2024-02-11 DIAGNOSIS — E669 Obesity, unspecified: Secondary | ICD-10-CM | POA: Diagnosis not present

## 2024-02-11 DIAGNOSIS — N920 Excessive and frequent menstruation with regular cycle: Secondary | ICD-10-CM | POA: Diagnosis not present

## 2024-02-11 DIAGNOSIS — Z79899 Other long term (current) drug therapy: Secondary | ICD-10-CM | POA: Diagnosis not present

## 2024-02-11 DIAGNOSIS — Z6841 Body Mass Index (BMI) 40.0 and over, adult: Secondary | ICD-10-CM | POA: Insufficient documentation

## 2024-02-11 DIAGNOSIS — Z641 Problems related to multiparity: Secondary | ICD-10-CM | POA: Diagnosis not present

## 2024-02-11 HISTORY — DX: Depression, unspecified: F32.A

## 2024-02-11 LAB — BASIC METABOLIC PANEL WITH GFR
Anion gap: 11 (ref 5–15)
BUN: 8 mg/dL (ref 6–20)
CO2: 21 mmol/L — ABNORMAL LOW (ref 22–32)
Calcium: 8.9 mg/dL (ref 8.9–10.3)
Chloride: 104 mmol/L (ref 98–111)
Creatinine, Ser: 0.8 mg/dL (ref 0.44–1.00)
GFR, Estimated: >60 mL/min
Glucose, Bld: 84 mg/dL (ref 70–99)
Potassium: 3.9 mmol/L (ref 3.5–5.1)
Sodium: 135 mmol/L (ref 135–145)

## 2024-02-11 LAB — CBC
HCT: 30.6 % — ABNORMAL LOW (ref 36.0–46.0)
Hemoglobin: 9.2 g/dL — ABNORMAL LOW (ref 12.0–15.0)
MCH: 21.1 pg — ABNORMAL LOW (ref 26.0–34.0)
MCHC: 30.1 g/dL (ref 30.0–36.0)
MCV: 70 fL — ABNORMAL LOW (ref 80.0–100.0)
Platelets: 290 10*3/uL (ref 150–400)
RBC: 4.37 MIL/uL (ref 3.87–5.11)
RDW: 20 % — ABNORMAL HIGH (ref 11.5–15.5)
WBC: 4.6 10*3/uL (ref 4.0–10.5)
nRBC: 0 % (ref 0.0–0.2)

## 2024-02-11 LAB — TYPE AND SCREEN
ABO/RH(D): O POS
Antibody Screen: NEGATIVE

## 2024-02-11 LAB — POCT PREGNANCY, URINE: Preg Test, Ur: NEGATIVE

## 2024-02-11 MED ORDER — SCOPOLAMINE 1 MG/3DAYS TD PT72
MEDICATED_PATCH | TRANSDERMAL | Status: AC
  Administered 2024-02-11: 1 mg via TRANSDERMAL
  Filled 2024-02-11: qty 1

## 2024-02-11 MED ORDER — MIDAZOLAM HCL 2 MG/2ML IJ SOLN
INTRAMUSCULAR | Status: AC
  Filled 2024-02-11: qty 2

## 2024-02-11 MED ORDER — HYDROMORPHONE HCL 1 MG/ML IJ SOLN
0.2500 mg | INTRAMUSCULAR | Status: DC | PRN
  Administered 2024-02-11 (×3): 0.5 mg via INTRAVENOUS

## 2024-02-11 MED ORDER — OXYCODONE HCL 5 MG PO TABS
5.0000 mg | ORAL_TABLET | Freq: Once | ORAL | Status: AC | PRN
  Administered 2024-02-11: 5 mg via ORAL

## 2024-02-11 MED ORDER — BUPIVACAINE HCL (PF) 0.25 % IJ SOLN
INTRAMUSCULAR | Status: DC | PRN
  Administered 2024-02-11: 10 mL

## 2024-02-11 MED ORDER — LACTATED RINGERS IV SOLN: INTRAVENOUS | Status: DC

## 2024-02-11 MED ORDER — HYDROMORPHONE HCL 1 MG/ML IJ SOLN
INTRAMUSCULAR | Status: AC
  Filled 2024-02-11: qty 1

## 2024-02-11 MED ORDER — FENTANYL CITRATE (PF) 100 MCG/2ML IJ SOLN
INTRAMUSCULAR | Status: AC
  Filled 2024-02-11: qty 2

## 2024-02-11 MED ORDER — ALBUMIN HUMAN 5 % IV SOLN: INTRAVENOUS | Status: DC | PRN

## 2024-02-11 MED ORDER — KETOROLAC TROMETHAMINE 30 MG/ML IJ SOLN
INTRAMUSCULAR | Status: DC | PRN
  Administered 2024-02-11: 30 mg via INTRAVENOUS

## 2024-02-11 MED ORDER — OXYCODONE HCL 5 MG PO TABS: 5.0000 mg | ORAL_TABLET | Freq: Four times a day (QID) | ORAL | 0 refills | Status: AC | PRN

## 2024-02-11 MED ORDER — PHENYLEPHRINE HCL-NACL 20-0.9 MG/250ML-% IV SOLN
INTRAVENOUS | Status: DC | PRN
  Administered 2024-02-11: 40 ug/min via INTRAVENOUS

## 2024-02-11 MED ORDER — MIDAZOLAM HCL (PF) 2 MG/2ML IJ SOLN: 0.5000 mg | Freq: Once | INTRAMUSCULAR | Status: DC | PRN

## 2024-02-11 MED ORDER — BUPIVACAINE HCL (PF) 0.25 % IJ SOLN
INTRAMUSCULAR | Status: AC
  Filled 2024-02-11: qty 30

## 2024-02-11 MED ORDER — ACETAMINOPHEN 500 MG PO TABS
1000.0000 mg | ORAL_TABLET | Freq: Once | ORAL | Status: AC
  Administered 2024-02-11: 1000 mg via ORAL
  Filled 2024-02-11: qty 2

## 2024-02-11 MED ORDER — LIDOCAINE-EPINEPHRINE 1 %-1:100000 IJ SOLN
INTRAMUSCULAR | Status: AC
  Filled 2024-02-11: qty 1

## 2024-02-11 MED ORDER — LIDOCAINE HCL (PF) 1 % IJ SOLN
INTRAMUSCULAR | Status: AC
  Filled 2024-02-11: qty 30

## 2024-02-11 MED ORDER — ROCURONIUM BROMIDE 10 MG/ML (PF) SYRINGE
PREFILLED_SYRINGE | INTRAVENOUS | Status: AC
  Filled 2024-02-11: qty 10

## 2024-02-11 MED ORDER — PROPOFOL 10 MG/ML IV BOLUS
INTRAVENOUS | Status: AC
  Filled 2024-02-11: qty 20

## 2024-02-11 MED ORDER — PHENYLEPHRINE 80 MCG/ML (10ML) SYRINGE FOR IV PUSH (FOR BLOOD PRESSURE SUPPORT)
PREFILLED_SYRINGE | INTRAVENOUS | Status: DC | PRN
  Administered 2024-02-11 (×2): 160 ug via INTRAVENOUS
  Administered 2024-02-11: 120 ug via INTRAVENOUS
  Administered 2024-02-11 (×2): 160 ug via INTRAVENOUS

## 2024-02-11 MED ORDER — FENTANYL CITRATE (PF) 250 MCG/5ML IJ SOLN
INTRAMUSCULAR | Status: DC | PRN
  Administered 2024-02-11 (×2): 50 ug via INTRAVENOUS

## 2024-02-11 MED ORDER — DEXAMETHASONE SOD PHOSPHATE PF 10 MG/ML IJ SOLN
INTRAMUSCULAR | Status: AC
  Filled 2024-02-11: qty 1

## 2024-02-11 MED ORDER — ONDANSETRON HCL 4 MG/2ML IJ SOLN
INTRAMUSCULAR | Status: DC | PRN
  Administered 2024-02-11: 4 mg via INTRAVENOUS

## 2024-02-11 MED ORDER — OXYCODONE HCL 5 MG PO TABS
ORAL_TABLET | ORAL | Status: AC
  Filled 2024-02-11: qty 1

## 2024-02-11 MED ORDER — ORAL CARE MOUTH RINSE: 15.0000 mL | Freq: Once | OROMUCOSAL | Status: AC

## 2024-02-11 MED ORDER — CEFAZOLIN SODIUM-DEXTROSE 2-4 GM/100ML-% IV SOLN
2.0000 g | INTRAVENOUS | Status: AC
  Administered 2024-02-11: 2 g via INTRAVENOUS
  Filled 2024-02-11: qty 100

## 2024-02-11 MED ORDER — SUGAMMADEX SODIUM 200 MG/2ML IV SOLN
INTRAVENOUS | Status: DC | PRN
  Administered 2024-02-11 (×2): 200 mg via INTRAVENOUS

## 2024-02-11 MED ORDER — SCOPOLAMINE 1 MG/3DAYS TD PT72: 1.0000 | MEDICATED_PATCH | TRANSDERMAL | Status: DC

## 2024-02-11 MED ORDER — PROPOFOL 10 MG/ML IV BOLUS
INTRAVENOUS | Status: DC | PRN
  Administered 2024-02-11: 150 mg via INTRAVENOUS

## 2024-02-11 MED ORDER — MEPERIDINE HCL 25 MG/ML IJ SOLN: 6.2500 mg | INTRAMUSCULAR | Status: DC | PRN

## 2024-02-11 MED ORDER — ROCURONIUM BROMIDE 10 MG/ML (PF) SYRINGE
PREFILLED_SYRINGE | INTRAVENOUS | Status: DC | PRN
  Administered 2024-02-11: 60 mg via INTRAVENOUS

## 2024-02-11 MED ORDER — OXYCODONE HCL 5 MG/5ML PO SOLN: 5.0000 mg | Freq: Once | ORAL | Status: AC | PRN

## 2024-02-11 MED ORDER — ACETAMINOPHEN 325 MG PO TABS: 650.0000 mg | ORAL_TABLET | Freq: Four times a day (QID) | ORAL | Status: AC | PRN

## 2024-02-11 MED ORDER — SILVER NITRATE-POT NITRATE 75-25 % EX MISC
CUTANEOUS | Status: AC
  Filled 2024-02-11: qty 10

## 2024-02-11 MED ORDER — ONDANSETRON HCL 4 MG/2ML IJ SOLN
INTRAMUSCULAR | Status: AC
  Filled 2024-02-11: qty 2

## 2024-02-11 MED ORDER — CHLORHEXIDINE GLUCONATE 0.12 % MT SOLN
15.0000 mL | Freq: Once | OROMUCOSAL | Status: AC
  Administered 2024-02-11: 15 mL via OROMUCOSAL
  Filled 2024-02-11: qty 15

## 2024-02-11 MED ORDER — MIDAZOLAM HCL (PF) 2 MG/2ML IJ SOLN
INTRAMUSCULAR | Status: DC | PRN
  Administered 2024-02-11: 2 mg via INTRAVENOUS

## 2024-02-11 MED ORDER — 0.9 % SODIUM CHLORIDE (POUR BTL) OPTIME
TOPICAL | Status: DC | PRN
  Administered 2024-02-11: 1000 mL

## 2024-02-11 MED ORDER — LIDOCAINE 2% (20 MG/ML) 5 ML SYRINGE
INTRAMUSCULAR | Status: AC
  Filled 2024-02-11: qty 5

## 2024-02-11 MED ORDER — LIDOCAINE 2% (20 MG/ML) 5 ML SYRINGE
INTRAMUSCULAR | Status: DC | PRN
  Administered 2024-02-11: 60 mg via INTRAVENOUS

## 2024-02-11 MED ORDER — SODIUM CHLORIDE 0.9 % IR SOLN
Status: DC | PRN
  Administered 2024-02-11: 3000 mL

## 2024-02-11 MED ORDER — POVIDONE-IODINE 10 % EX SWAB
2.0000 | Freq: Once | CUTANEOUS | Status: AC
  Administered 2024-02-11: 2 via TOPICAL

## 2024-02-11 MED ORDER — DEXAMETHASONE SOD PHOSPHATE PF 10 MG/ML IJ SOLN
INTRAMUSCULAR | Status: DC | PRN
  Administered 2024-02-11: 10 mg via INTRAVENOUS

## 2024-02-11 NOTE — Anesthesia Preprocedure Evaluation (Signed)
"                                    Anesthesia Evaluation  Patient identified by MRN, date of birth, ID band Patient awake    Reviewed: Allergy & Precautions, NPO status , Patient's Chart, lab work & pertinent test results  History of Anesthesia Complications Negative for: history of anesthetic complications  Airway Mallampati: I  TM Distance: >3 FB Neck ROM: Full    Dental  (+) Dental Advisory Given   Pulmonary neg pulmonary ROS   breath sounds clear to auscultation       Cardiovascular negative cardio ROS  Rhythm:Regular Rate:Normal     Neuro/Psych negative neurological ROS     GI/Hepatic Neg liver ROS,GERD  Poorly Controlled,,  Endo/Other  BMI 45.5  Renal/GU negative Renal ROS     Musculoskeletal   Abdominal   Peds  Hematology Hb 9.2, plt 290k   Anesthesia Other Findings   Reproductive/Obstetrics                              Anesthesia Physical Anesthesia Plan  ASA: 2  Anesthesia Plan: General   Post-op Pain Management: Tylenol  PO (pre-op)*   Induction: Intravenous  PONV Risk Score and Plan: 3 and Ondansetron , Dexamethasone  and Scopolamine  patch - Pre-op  Airway Management Planned: Oral ETT  Additional Equipment: None  Intra-op Plan:   Post-operative Plan: Extubation in OR  Informed Consent: I have reviewed the patients History and Physical, chart, labs and discussed the procedure including the risks, benefits and alternatives for the proposed anesthesia with the patient or authorized representative who has indicated his/her understanding and acceptance.     Dental advisory given  Plan Discussed with: CRNA and Surgeon  Anesthesia Plan Comments:         Anesthesia Quick Evaluation  "

## 2024-02-11 NOTE — Anesthesia Postprocedure Evaluation (Signed)
"   Anesthesia Post Note  Patient: Victoria Knight  Procedure(s) Performed: DILATION AND CURETTAGE; HYSTEROSCOPY HYDROTHERMAL ABLATION, ENDOMETRIUM (Vagina ) LAPAROSCOPIC SALPINGECTOMY, BILATERAL W/EXCISION OF RIGHT OVARIAN CYST WALL (Bilateral: Abdomen)     Patient location during evaluation: PACU Anesthesia Type: General Level of consciousness: awake and alert, oriented and patient cooperative Pain management: pain level controlled Vital Signs Assessment: post-procedure vital signs reviewed and stable Respiratory status: spontaneous breathing, nonlabored ventilation and respiratory function stable Cardiovascular status: blood pressure returned to baseline and stable Postop Assessment: no apparent nausea or vomiting Anesthetic complications: no   No notable events documented.  Last Vitals:  Vitals:   02/11/24 1645 02/11/24 1700  BP: 111/80 123/78  Pulse: 72 69  Resp: 11 14  Temp:    SpO2: 97% 100%    Last Pain:  Vitals:   02/11/24 1715  TempSrc:   PainSc: 3                  Howard Patton,E. Rockie Vawter      "

## 2024-02-11 NOTE — Op Note (Signed)
 02/11/2024 Victoria Knight   Procedure:  1)Laparoscopic sterilization by bilateral salpingectomy and excision of right ovarian cyst 2) Hysteroscopy, endometrial curettage and Hydrothermal ablation  Preoperative diagnosis: Multiparity, permanent sterilization desired                                       Menometrorrhagia with failed medical therapy  Postoperative diagnosis: Same and right ovarian simple cyst   Surgeon: Robbi Render, MD Assistants: Farrel Rattler RNFA  Anesthesia Gen. Endotracheal IV fluids LR  (see anesthesia record) and one Albumin   (for low BP after intubation)  EBL minimal  20 cc  Urine clear (straight cath pre-op)  30 cc Complications none Disposition PACU and home Specimens: Right and left Fallopian tubes, right ovary cyst wall, endometrial curettings   Procedure Patient with menometrorrhagia, failed medical therapy, anemia, desiring endometrial ablation and also requesting permanent sterilization via salpingectomy.  Patient declined other options. Risk and complications of surgery including infection, bleeding, damage to internal organs, other complications including pneumonia, VTE were reviewed. Also discussed irreversibility and rare ectopic pregnancy.  Patient voiced understanding. Informed written consent was obtained and patient was brought to the operating room with IV running. Timeout was carried out. Victoria Knight underwent general endotracheal anesthesia without difficulty and was given dorsal lithotomy position with right arm tucked, left arm kept out for IV access. Examination under anesthesia revealed retroverted 8 week size uterus, normal cervix. Parts prepped and draped in standard fashion. Bladder was emptied with straight catheter with clear urine. Speculum was placed anterior lip of cervix was grasped with tenaculum, uterus was sounded to 8 cm and was anteverted. A Hulka manipulator was introduced in the uterine cavity and secured to cervix.  Gloves changed  attention was focused on the abdomen. A 10 mm vertical incision was made at upper fold of umbilicus after injecting 0.25% Marcaine . Incision was carried down to the fascia was incised peritoneal entry was confirmed, no adhesions palpated. Stay suture of 0 Vicryl was taken on the fascia and Hassan cannula was inserted and secured with balloon. Insufflation was begun with CO2. Laparoscope inserted, no concerns noted. Patient was given Trendelenburg position. Entry site was uneventful. Uterus had a single adhesion from fundus to left lateral wall. Rest was normal. Tubes were normal. Left ovary normal, right ovary with 1 cm superficial simple cyst on the surface without excrescences. Ureters normal.  Two lower 5 mm trocars inserted one in each lower quadrants after marcaine  injection and entry made with visual assistance  Left lateral fundal adhesion was cut at the uterine end using Ligasure, hemostasis noted. Left fallopian tube grasped by RNFA with Million dollar clamp and I used Ligasure to perform salpingectomy. Hemostasis note.  Now right tube grasped and right salpingectomy performed. Both tubes were removed from [facility] port while camera was in left lower port for visual assistance.  Right ovary cyst was superficial on the surface, it was excised using Ligasure and handed off for pathology, Hemostasis checked and was excellent.  All instruments were removed under vision. Pneumoperitoneum was released. Stay suture at the fascia were tied off while tenting abdominal wall and assessing no entrapment with finger tip.  Excellent fascial closure noted. Skin incisions approximated using 4-0 Vicryl in subcuticular fashion. Dermabond applied.   Uterine manipulator removed, tenaculum left in place.  Dr Render proceeded with Second part- Hysteroscopy, endometrial curettage and HTA ablation Speculum placed.  Tenaculum was already  left in place. Cervical os noted to be dilated enough to accommodate 17 F dilator.   Endometrial currettage done and tissue handed off for pathology.  Diagnostic Hysteroscopy performed with HTA ready to go. Thick endometrium noted but no specific polyp/ fibroid noted.  Hydrothermal Ablation with HTA began first by checking fluid leak, less than 3 cc leak noted, so test passed.  Now heating began per protocol.. At 1 min before 10 min heat cycle (ie at 9 min) machine gave error about the cassette. At this point excellent ablation was noted with tissue appearing gray/white. So we performed manual shut down that took 10 min to cool down since it was manual. Once cool down ws complete, I remove hysteroscope per instructions.  Minimal bleeding noted,  Tenaculum removed.  Patient was given 60mg  Toradol  before waking up.  All counts are correct x2. No complications.  Patient brought to PACU stable and will be discharged home Findings discussed with her husband  I performed this surgery Robbi Render, MD.

## 2024-02-11 NOTE — Anesthesia Procedure Notes (Addendum)
 Procedure Name: Intubation Date/Time: 02/11/2024 2:39 PM  Performed by: Elby Raelene SAUNDERS, CRNAPre-anesthesia Checklist: Patient identified, Emergency Drugs available, Suction available and Patient being monitored Patient Re-evaluated:Patient Re-evaluated prior to induction Oxygen Delivery Method: Circle System Utilized Preoxygenation: Pre-oxygenation with 100% oxygen Induction Type: IV induction Ventilation: Mask ventilation without difficulty Laryngoscope Size: Miller and 2 Grade View: Grade II Tube type: Oral Tube size: 7.0 mm Number of attempts: 1 Airway Equipment and Method: Stylet and Bite block Placement Confirmation: ETT inserted through vocal cords under direct vision, positive ETCO2 and breath sounds checked- equal and bilateral Secured at: 22 cm Tube secured with: Tape Dental Injury: Teeth and Oropharynx as per pre-operative assessment

## 2024-02-11 NOTE — Transfer of Care (Signed)
 Immediate Anesthesia Transfer of Care Note  Patient: Victoria Knight  Procedure(s) Performed: DILATION AND CURETTAGE; HYSTEROSCOPY HYDROTHERMAL ABLATION, ENDOMETRIUM (Vagina ) LAPAROSCOPIC SALPINGECTOMY, BILATERAL W/EXCISION OF RIGHT OVARIAN CYST WALL (Bilateral: Abdomen)  Patient Location: PACU  Anesthesia Type:General  Level of Consciousness: awake, alert , and oriented  Airway & Oxygen Therapy: Patient Spontanous Breathing  Post-op Assessment: Report given to RN and Post -op Vital signs reviewed and stable  Post vital signs: Reviewed and stable  Last Vitals:  Vitals Value Taken Time  BP 120/79 02/11/24 16:19  Temp    Pulse 77 02/11/24 16:21  Resp 16   SpO2 98 % 02/11/24 16:21  Vitals shown include unfiled device data.  Last Pain:  Vitals:   02/11/24 1103  TempSrc:   PainSc: 0-No pain         Complications: No notable events documented.

## 2024-02-11 NOTE — H&P (Signed)
 Victoria Knight is an 39 y.o. female. 39 yo MF with menorrhagia, failed all medical therapy and wants endometrial ablation. also wants to permanent sterilization with tubal ligation  Has menorrhagia. Didnt tolerate Mirena. removed due to acne, weight gain, mood changes Now Lysteda for menorrhagia and is helping but wants to get ablation. No fam Hx of uterus cancer. PMDD didnt tolerate Zoloft. Is on Wellbutrin and is seeing therapist and Psych doc. Spironolactone for acne.   G2P2, IVF kids. C/s x 2 (2013, 2019), Lap-band surgery 2010. BC condoms. obesity remote abn Pap hx. recent nl Paps. Mammogram   Pelvic sono- Uterus 10.4x6.7x5.8 cm 137 cc one fibroid 1.5 cm. EMS 9 mm. Simple cyst right ovary 3.8 cm x3 cm, no blood flow. LO nl trace ff  Endometrial biopsy normal. No hyperplasia/ cancer     Patient's last menstrual period was 01/23/2024 (approximate).    Past Medical History:  Diagnosis Date   Anemia    Contact lens/glasses fitting    Depression    Iron  deficiency anemia 10/27/2011   Normal labor 10/26/2011   Postpartum care following cesarean delivery 10/28/2011   PROM (premature rupture of membranes) 10/26/2011   S/P cesarean section (arrest of dialation, 10/13) 10/28/2011   SOB (shortness of breath)     Past Surgical History:  Procedure Laterality Date   CESAREAN SECTION  10/27/2011   Procedure: CESAREAN SECTION;  Surgeon: Marie-Lyne Lavoie, MD;  Location: [facility];  Service: Obstetrics;  Laterality: N/A;  Primary cesarean section with delivery of baby boy at 73. Apgars  9/9.   CESAREAN SECTION N/A 08/11/2017   Procedure: CESAREAN SECTION;  Surgeon: Barbette Knock, MD;  Location: [facility];  Service: Obstetrics;  Laterality: N/A;   CHOLECYSTECTOMY N/A 12/06/2017   Procedure: LAPAROSCOPIC CHOLECYSTECTOMY;  Surgeon: Signe Mitzie LABOR, MD;  Location: [facility] OR;  Service: General;  Laterality: N/A;   LAPAROSCOPIC GASTRIC BANDING  09/13/08   OVARIAN CYST REMOVAL Left 2018     Family History  Problem Relation Age of Onset   Breast cancer Mother 15   Heart disease Father        congestive heart failure   Hypertension Father    Arthritis Father    Diabetes Father    Gout Father     Social History:  reports that she has never smoked. She has never used smokeless tobacco. She reports that she does not drink alcohol and does not use drugs.  Allergies: Allergies[1]  No medications prior to admission.   Review of Systems  Last menstrual period 01/23/2024, unknown if currently breastfeeding. Physical Exam Physical exam:  A&O x 3, no acute distress. Pleasant HEENT neg, no thyromegaly Lungs CTA bilat CV RRR, S1S2 normal Abdo soft, non tender, non acute Extr no edema/ tenderness Pelvic cx nl, uterus bulky, no adnexal mass   Assessment/Plan: 39 yo female with menorrhagia, wanting ablation with HTA and laparoscopic tubal sterilization  Reviewed r/c and <40 yrs age with higher failure rate and needing other intervention incl hysterectomy.. Also difficuly assessing uterus cancer due to ablation related scarring  Reviewed risk of regret from permanent sterilization, recommend salpingectomy agrees  Risks/complications of surgery reviewed incl infection, bleeding, damage to internal organs including bladder, bowels, ureters, blood vessels, other risks from anesthesia, VTE and delayed complications of any surgery, complications in future surgery reviewed  Knock SAUNDERS Antonetta Clanton 02/11/2024, 7:19 AM     [1] No Known Allergies

## 2024-02-11 NOTE — Progress Notes (Signed)
 This was 1 pm scheduled case and we were not in OR until almost 2.45 pm due to delay caused by prior surgeon

## 2024-02-12 ENCOUNTER — Encounter (HOSPITAL_COMMUNITY): Payer: Self-pay | Admitting: Obstetrics & Gynecology

## 2024-02-13 LAB — SURGICAL PATHOLOGY
